# Patient Record
Sex: Female | Born: 1960 | Race: Black or African American | Hispanic: No | Marital: Married | Smoking: Never smoker
Health system: Southern US, Community
[De-identification: ages and names within clinical notes are randomized; demographics above are authoritative.]

## PROBLEM LIST (undated history)

## (undated) DIAGNOSIS — Z8659 Personal history of other mental and behavioral disorders: Secondary | ICD-10-CM

## (undated) DIAGNOSIS — T7840XA Allergy, unspecified, initial encounter: Secondary | ICD-10-CM

## (undated) DIAGNOSIS — E079 Disorder of thyroid, unspecified: Secondary | ICD-10-CM

## (undated) DIAGNOSIS — G8929 Other chronic pain: Secondary | ICD-10-CM

## (undated) HISTORY — PX: MYOMECTOMY: SHX85

## (undated) HISTORY — PX: BUNIONECTOMY: SHX129

## (undated) HISTORY — DX: Disorder of thyroid, unspecified: E07.9

## (undated) HISTORY — DX: Personal history of other mental and behavioral disorders: Z86.59

## (undated) HISTORY — DX: Allergy, unspecified, initial encounter: T78.40XA

## (undated) HISTORY — DX: Other chronic pain: G89.29

---

## 1997-05-20 ENCOUNTER — Other Ambulatory Visit: Admission: RE | Admit: 1997-05-20 | Discharge: 1997-05-20 | Payer: Self-pay | Admitting: Obstetrics & Gynecology

## 1998-05-12 ENCOUNTER — Encounter: Payer: Self-pay | Admitting: Obstetrics and Gynecology

## 1998-05-12 ENCOUNTER — Ambulatory Visit (HOSPITAL_COMMUNITY): Admission: RE | Admit: 1998-05-12 | Discharge: 1998-05-12 | Payer: Self-pay | Admitting: Obstetrics and Gynecology

## 1999-04-22 ENCOUNTER — Other Ambulatory Visit: Admission: RE | Admit: 1999-04-22 | Discharge: 1999-04-22 | Payer: Self-pay | Admitting: Obstetrics and Gynecology

## 1999-05-26 ENCOUNTER — Encounter: Payer: Self-pay | Admitting: Obstetrics and Gynecology

## 1999-05-26 ENCOUNTER — Ambulatory Visit (HOSPITAL_COMMUNITY): Admission: RE | Admit: 1999-05-26 | Discharge: 1999-05-26 | Payer: Self-pay | Admitting: Obstetrics and Gynecology

## 1999-06-01 ENCOUNTER — Emergency Department (HOSPITAL_COMMUNITY): Admission: EM | Admit: 1999-06-01 | Discharge: 1999-06-01 | Payer: Self-pay | Admitting: Emergency Medicine

## 1999-06-02 ENCOUNTER — Encounter: Payer: Self-pay | Admitting: Emergency Medicine

## 1999-07-05 ENCOUNTER — Inpatient Hospital Stay (HOSPITAL_COMMUNITY): Admission: RE | Admit: 1999-07-05 | Discharge: 1999-07-07 | Payer: Self-pay | Admitting: Obstetrics and Gynecology

## 2000-06-02 ENCOUNTER — Inpatient Hospital Stay (HOSPITAL_COMMUNITY): Admission: AD | Admit: 2000-06-02 | Discharge: 2000-06-03 | Payer: Self-pay | Admitting: Obstetrics and Gynecology

## 2000-06-03 ENCOUNTER — Encounter: Payer: Self-pay | Admitting: Obstetrics and Gynecology

## 2000-06-04 ENCOUNTER — Encounter (INDEPENDENT_AMBULATORY_CARE_PROVIDER_SITE_OTHER): Payer: Self-pay | Admitting: Specialist

## 2000-06-04 ENCOUNTER — Inpatient Hospital Stay (HOSPITAL_COMMUNITY): Admission: AD | Admit: 2000-06-04 | Discharge: 2000-06-04 | Payer: Self-pay | Admitting: Obstetrics and Gynecology

## 2000-06-04 ENCOUNTER — Ambulatory Visit (HOSPITAL_COMMUNITY): Admission: RE | Admit: 2000-06-04 | Discharge: 2000-06-04 | Payer: Self-pay | Admitting: Obstetrics and Gynecology

## 2000-09-15 ENCOUNTER — Other Ambulatory Visit: Admission: RE | Admit: 2000-09-15 | Discharge: 2000-09-15 | Payer: Self-pay | Admitting: Obstetrics and Gynecology

## 2000-09-19 ENCOUNTER — Encounter: Admission: RE | Admit: 2000-09-19 | Discharge: 2000-09-19 | Payer: Self-pay | Admitting: Family Medicine

## 2000-09-19 ENCOUNTER — Encounter: Payer: Self-pay | Admitting: Family Medicine

## 2000-11-13 ENCOUNTER — Encounter: Payer: Self-pay | Admitting: Gastroenterology

## 2000-11-13 ENCOUNTER — Ambulatory Visit (HOSPITAL_COMMUNITY): Admission: RE | Admit: 2000-11-13 | Discharge: 2000-11-13 | Payer: Self-pay | Admitting: Gastroenterology

## 2001-11-28 ENCOUNTER — Other Ambulatory Visit: Admission: RE | Admit: 2001-11-28 | Discharge: 2001-11-28 | Payer: Self-pay | Admitting: Obstetrics and Gynecology

## 2002-06-12 ENCOUNTER — Encounter: Payer: Self-pay | Admitting: Obstetrics and Gynecology

## 2002-06-12 ENCOUNTER — Ambulatory Visit (HOSPITAL_COMMUNITY): Admission: RE | Admit: 2002-06-12 | Discharge: 2002-06-12 | Payer: Self-pay | Admitting: Urology

## 2003-01-24 ENCOUNTER — Other Ambulatory Visit: Admission: RE | Admit: 2003-01-24 | Discharge: 2003-01-24 | Payer: Self-pay | Admitting: Obstetrics and Gynecology

## 2004-02-03 ENCOUNTER — Other Ambulatory Visit: Admission: RE | Admit: 2004-02-03 | Discharge: 2004-02-03 | Payer: Self-pay | Admitting: Obstetrics and Gynecology

## 2004-10-11 ENCOUNTER — Encounter: Admission: RE | Admit: 2004-10-11 | Discharge: 2005-01-09 | Payer: Self-pay | Admitting: Anesthesiology

## 2004-10-19 ENCOUNTER — Other Ambulatory Visit: Admission: RE | Admit: 2004-10-19 | Discharge: 2004-10-19 | Payer: Self-pay | Admitting: Obstetrics and Gynecology

## 2004-11-05 ENCOUNTER — Ambulatory Visit: Payer: Self-pay | Admitting: Physical Medicine & Rehabilitation

## 2004-11-05 ENCOUNTER — Encounter
Admission: RE | Admit: 2004-11-05 | Discharge: 2005-02-03 | Payer: Self-pay | Admitting: Physical Medicine & Rehabilitation

## 2005-03-15 ENCOUNTER — Ambulatory Visit (HOSPITAL_COMMUNITY): Admission: RE | Admit: 2005-03-15 | Discharge: 2005-03-15 | Payer: Self-pay | Admitting: Obstetrics and Gynecology

## 2005-06-14 ENCOUNTER — Emergency Department (HOSPITAL_COMMUNITY): Admission: EM | Admit: 2005-06-14 | Discharge: 2005-06-15 | Payer: Self-pay | Admitting: Emergency Medicine

## 2006-05-24 ENCOUNTER — Ambulatory Visit (HOSPITAL_COMMUNITY): Admission: RE | Admit: 2006-05-24 | Discharge: 2006-05-24 | Payer: Self-pay | Admitting: Obstetrics and Gynecology

## 2007-09-20 ENCOUNTER — Ambulatory Visit (HOSPITAL_COMMUNITY): Admission: RE | Admit: 2007-09-20 | Discharge: 2007-09-20 | Payer: Self-pay | Admitting: Obstetrics and Gynecology

## 2007-12-19 ENCOUNTER — Other Ambulatory Visit: Admission: RE | Admit: 2007-12-19 | Discharge: 2007-12-19 | Payer: Self-pay | Admitting: Obstetrics and Gynecology

## 2009-03-02 ENCOUNTER — Encounter (INDEPENDENT_AMBULATORY_CARE_PROVIDER_SITE_OTHER): Payer: Self-pay | Admitting: *Deleted

## 2009-03-04 ENCOUNTER — Ambulatory Visit: Payer: Self-pay | Admitting: Internal Medicine

## 2009-03-05 ENCOUNTER — Ambulatory Visit (HOSPITAL_COMMUNITY): Admission: RE | Admit: 2009-03-05 | Discharge: 2009-03-05 | Payer: Self-pay | Admitting: Obstetrics and Gynecology

## 2009-03-24 ENCOUNTER — Ambulatory Visit: Payer: Self-pay | Admitting: Internal Medicine

## 2009-03-31 ENCOUNTER — Encounter: Payer: Self-pay | Admitting: Internal Medicine

## 2010-02-07 ENCOUNTER — Encounter: Payer: Self-pay | Admitting: Obstetrics and Gynecology

## 2010-02-18 NOTE — Letter (Signed)
Summary: [facility] Instructions  [facility]  [address] [place], [place] [address]   Phone: [phone]  Fax: [phone]       Katrina Marquez    1960/09/22    MRN: [id number]       Procedure Day /Date:  Tuesday 03/24/2009     Arrival Time:  10:30 am     Procedure Time: 11:30 am     Location of Procedure:                    _x _  [facility] (4th Floor)    PREPARATION FOR COLONOSCOPY WITH MIRALAX  Starting 5 days prior to your procedure Thursday 3/3 do not eat nuts, seeds, popcorn, corn, beans, peas,  salads, or any raw vegetables.  Do not take any fiber supplements (e.g. Metamucil, Citrucel, and Benefiber). ____________________________________________________________________________________________________   THE DAY BEFORE YOUR PROCEDURE         DATE: Monday 3/7  1   Drink clear liquids the entire day-NO SOLID FOOD  2   Do not drink anything colored red or purple.  Avoid juices with pulp.  No orange juice.  3   Drink at least 64 oz. (8 glasses) of fluid/clear liquids during the day to prevent dehydration and help the prep work efficiently.  CLEAR LIQUIDS INCLUDE: Water Jello Ice Popsicles Tea (sugar ok, no milk/cream) Powdered fruit flavored drinks Coffee (sugar ok, no milk/cream) Gatorade Juice: apple, white grape, white cranberry  Lemonade Clear bullion, consomm, broth Carbonated beverages (any kind) Strained chicken noodle soup Hard Candy  4   Mix the entire bottle of Miralax with 64 oz. of Gatorade/Powerade in the morning and put in the refrigerator to chill.  5   At 3:00 pm take 2 Dulcolax/Bisacodyl tablets.  6   At 4:30 pm take one Reglan/Metoclopramide tablet.  7  Starting at 5:00 pm drink one 8 oz glass of the Miralax mixture every 15-20 minutes until you have finished drinking the entire 64 oz.  You should finish drinking prep around 7:30 or 8:00 pm.  8   If you are nauseated, you may take the 2nd Reglan/Metoclopramide  tablet at 6:30 pm.        9    At 8:00 pm take 2 more DULCOLAX/Bisacodyl tablets.     THE DAY OF YOUR PROCEDURE      DATE:  Tuesday 3/8  You may drink clear liquids until 9:30 am  (2 HOURS BEFORE PROCEDURE).   MEDICATION INSTRUCTIONS  Unless otherwise instructed, you should take regular prescription medications with a small sip of water as early as possible the morning of your procedure.           OTHER INSTRUCTIONS  You will need a responsible adult at least 52 years of age to accompany you and drive you home.   This person must remain in the waiting room during your procedure.  Wear loose fitting clothing that is easily removed.  Leave jewelry and other valuables at home.  However, you may wish to bring a book to read or an iPod/MP3 player to listen to music as you wait for your procedure to start.  Remove all body piercing jewelry and leave at home.  Total time from sign-in until discharge is approximately 2-3 hours.  You should go home directly after your procedure and rest.  You can resume normal activities the day after your procedure.  The day of your procedure you should not:   Drive  Make legal decisions   Operate machinery   Drink alcohol   Return to work  You will receive specific instructions about eating, activities and medications before you leave.   The above instructions have been reviewed and explained to me by   Ezra Sites RN  March 04, 2009 2:02 PM     I fully understand and can verbalize these instructions _____________________________ Date _______

## 2010-02-18 NOTE — Procedures (Signed)
Summary: Colonoscopy  Patient: Del Graig Note: All result statuses are Final unless otherwise noted.  Tests: (1) Colonoscopy (COL)   COL Colonoscopy           DONE     [facility]     [address]     [place], [place]  [address]           COLONOSCOPY PROCEDURE REPORT           PATIENT:  Katrina Marquez, Katrina Marquez  MR#:  [id number]     BIRTHDATE:  [birth year], 48 yrs. old  GENDER:  female           ENDOSCOPIST:  Hedwig Morton. Juanda Chance, MD     Referred by:  Meredeth Ide, M.D.           PROCEDURE DATE:  03/24/2009     PROCEDURE:  Colonoscopy 19147     ASA CLASS:  Class I     INDICATIONS:  Routine Risk Screening           MEDICATIONS:   Versed 9 mg, Fentanyl 75 mcg           DESCRIPTION OF PROCEDURE:   After the risks benefits and     alternatives of the procedure were thoroughly explained, informed     consent was obtained.  Digital rectal exam was performed and     revealed no rectal masses.   The LB CF-[id number] endoscope     was introduced through the anus and advanced to the cecum, which     was identified by both the appendix and ileocecal valve, without     limitations.  The quality of the prep was good, using MiraLax.     The instrument was then slowly withdrawn as the colon was fully     examined.     <<PROCEDUREIMAGES>>           FINDINGS:  A sessile polyp was found. polyp vs a fold The polyp     was removed using cold biopsy forceps (see image4 and image5).     This was otherwise a normal examination of the colon (see image6,     image3, image2, and image1).   Retroflexed views in the rectum     revealed no abnormalities.    The scope was then withdrawn from     the patient and the procedure completed.           COMPLICATIONS:  None           ENDOSCOPIC IMPRESSION:     1) Sessile polyp     2) Otherwise normal examination     polyp vs irregular mucosal from scope trauma     RECOMMENDATIONS:     1) Await pathology results           REPEAT EXAM:  In 10  year(s) for.           ______________________________     Hedwig Morton. Juanda Chance, MD           CC:           n.     eSIGNED:   Hedwig Morton. Maylea Soria at 03/24/2009 12:51 PM           [place], Anes, [phone]  Note: An exclamation mark (!) indicates a result that was not dispersed into the flowsheet. Document Creation Date: 03/24/2009 12:51 PM _______________________________________________________________________  (1) Order result status: Final Collection or observation date-time: 03/24/2009 12:45 Requested date-time:  Receipt  date-time:  Reported date-time:  Referring Physician:   Ordering Physician: Lina Sar [phone]) Specimen Source:  Source: Launa Grill Order Number: [id number] Lab site:   Appended Document: Colonoscopy     Procedures Next Due Date:    Colonoscopy: 03/2014

## 2010-02-18 NOTE — Letter (Signed)
Summary: Patient Notice- Polyp Results  [facility]  [address] [place], [place] [address]   Phone: [phone]  Fax: [phone]        March 27, 2009 MRN: [id number]    [facility] [address] [place], [place]  [address]    Dear Katrina Marquez,  I am pleased to inform you that the colon polyp(s) removed during your recent colonoscopy was (were) found to be benign (no cancer detected) upon pathologic examination.The polyp was adenomatous ( precancerous)  I recommend you have a repeat colonoscopy examination in 5_ years to look for recurrent polyps, as having colon polyps increases your risk for having recurrent polyps or even colon cancer in the future.  Should you develop new or worsening symptoms of abdominal pain, bowel habit changes or bleeding from the rectum or bowels, please schedule an evaluation with either your primary care physician or with me.  Additional information/recommendations:  _x_ No further action with gastroenterology is needed at this time. Please      follow-up with your primary care physician for your other healthcare      needs.  __ Please call [phone] to schedule a return visit to review your      situation.  __ Please keep your follow-up visit as already scheduled.  __ Continue treatment plan as outlined the day of your exam.  Please call us if you are having persistent problems or have questions about your condition that have not been fully answered at this time.  Sincerely,  Hart Carwin MD  This letter has been electronically signed by your physician.  Appended Document: Patient Notice- Polyp Results Letter mailed 3.15.11.

## 2010-02-18 NOTE — Letter (Signed)
Summary: Patient Notice- Polyp Results  [facility]  [address] [place], [place] [address]   Phone: [phone]  Fax: [phone]        March 27, 2009 MRN: [id number]    [facility] [address] [place], [place]  [address]    Dear Ms. Hoecker,  I am pleased to inform you that the colon polyp(s) removed during your recent colonoscopy was (were) found to be benign (no cancer detected) upon pathologic examination.The polyp was adenomatous ( precancerous)  I recommend you have a repeat colonoscopy examination in 5_ years to look for recurrent polyps, as having colon polyps increases your risk for having recurrent polyps or even colon cancer in the future.  Should you develop new or worsening symptoms of abdominal pain, bowel habit changes or bleeding from the rectum or bowels, please schedule an evaluation with either your primary care physician or with me.  Additional information/recommendations:  _x_ No further action with gastroenterology is needed at this time. Please      follow-up with your primary care physician for your other healthcare      needs.  __ Please call [phone] to schedule a return visit to review your      situation.  __ Please keep your follow-up visit as already scheduled.  __ Continue treatment plan as outlined the day of your exam.  Please call us if you are having persistent problems or have questions about your condition that have not been fully answered at this time.  Sincerely,  Hart Carwin MD  This letter has been electronically signed by your physician.

## 2010-02-18 NOTE — Miscellaneous (Signed)
Summary: [facility]  Clinical Lists Changes  Medications: Added new medication of MIRALAX   POWD (POLYETHYLENE GLYCOL 3350) As per prep  instructions. - Signed Added new medication of REGLAN 10 MG  TABS (METOCLOPRAMIDE HCL) As per prep instructions. - Signed Added new medication of DULCOLAX 5 MG  TBEC (BISACODYL) Day before procedure take 2 at 3pm and 2 at 8pm. - Signed Rx of MIRALAX   POWD (POLYETHYLENE GLYCOL 3350) As per prep  instructions.;  #255gm x 0;  Signed;  Entered by: Ezra Sites RN;  Authorized by: Hart Carwin MD;  Method used: Electronically to [facility]  [phone]*, [address] [place], [place]  [address], Ph: [phone] or [phone], Fax: [phone] Rx of REGLAN 10 MG  TABS (METOCLOPRAMIDE HCL) As per prep instructions.;  #2 x 0;  Signed;  Entered by: Ezra Sites RN;  Authorized by: Hart Carwin MD;  Method used: Electronically to [facility]  [phone]*, [address], [place], [place]  [address], Ph: [phone] or [phone], Fax: [phone] Rx of DULCOLAX 5 MG  TBEC (BISACODYL) Day before procedure take 2 at 3pm and 2 at 8pm.;  #4 x 0;  Signed;  Entered by: Ezra Sites RN;  Authorized by: Hart Carwin MD;  Method used: Electronically to [facility]  [phone]*, [address], [place], [place]  [address], Ph: [phone] or [phone], Fax: [phone] Observations: Added new observation of NKA: T (03/04/2009 13:32)    Prescriptions: DULCOLAX 5 MG  TBEC (BISACODYL) Day before procedure take 2 at 3pm and 2 at 8pm.  #4 x 0   Entered by:   Ezra Sites RN   Authorized by:   Hart Carwin MD   Signed by:   Ezra Sites RN on 03/04/2009   Method used:   Electronically to        [facility]  [phone]* (retail)       [address] [place], [place]  [address]       Ph: [phone] or [phone]       Fax: [phone]   RxID:   [id number] REGLAN 10 MG  TABS (METOCLOPRAMIDE HCL) As per prep instructions.  #2 x 0   Entered by:   Ezra Sites RN   Authorized by:   Hart Carwin MD   Signed by:   Ezra Sites RN on 03/04/2009   Method used:   Electronically to        [facility]  [phone]* (retail)       [address] [place], [place]  [address]       Ph: [phone] or [phone]       Fax: [phone]   RxID:   [id number] MIRALAX   POWD (POLYETHYLENE GLYCOL 3350) As per prep  instructions.  #255gm x 0   Entered by:   Ezra Sites RN   Authorized by:   Hart Carwin MD   Signed by:   Ezra Sites RN on 03/04/2009   Method used:   Electronically to        [facility]  [phone]* (retail)       [address] [place], [place]  [address]       Ph: [phone] or [phone]       Fax: [phone]   RxID:   [id number]

## 2010-06-04 NOTE — Op Note (Signed)
[facility]  Patient:    Katrina Marquez, Katrina Marquez                       MRN: [id number] Proc. Date: 06/04/00 Adm. Date:  [id number] Attending:  Leonard Schwartz                           Operative Report  PREOPERATIVE DIAGNOSIS:         First trimester missed abortion.  POSTOPERATIVE DIAGNOSIS:        First trimester missed abortion.  OPERATION:                      Suction, dilatation and evacuation.  SURGEON:                        Janine Limbo, M.D.  ANESTHESIA:                     IV sedation and paracervical block using 0.50% Marcaine.  DISPOSITION:                    [name] is a 50 year old female, gravida 2, para 1-0-0-1 who presents with a quantitative hCG of 10,494 (Jun 02, 2000), and then decreasing to 7689 (Jun 04, 2000).  An ultrasound showed an empty sac.  The patient was noted to have a blood type of O negative and she received RhoGAM on Jun 02, 2000.  The patient understands the indications for her procedure, and she accepts the risks of, but not limited to, anesthetic complications, bleeding, infections, and possible damage to the surrounding organs.  FINDINGS:                       The patients hemoglobin today was 12.5.  Her white count was 6600.  A moderate amount of products of conception were removed from within the uterine cavity.  DESCRIPTION OF PROCEDURE:       The patient was taken to the operating room where she was given medication through her IV line.  The perineum and vagina were prepped in multiple layers of Hibiclens and then sterilely draped. Examination under anesthesia was performed.  The bladder was drained of urine. A paracervical block was placed using 10 cc of 0.25% Marcaine without epinephrine.  The uterus was sounded to 9 cm.  The cervix was grasped with the dilator.  The uterine cavity was evacuated using a size 10 suction curet and then a medium sharp curet.  The cavity was felt to be clean at  the end of our procedure.  Hemostasis was noted to be adequate.  All instruments were removed.  Repeat examination showed a firm uterus that was approximately 6-8 weeks size.  The estimated blood loss was 20 cc.  The patient was taken to the recovery room in stable condition.  FOLLOW-UP INSTRUCTIONS:         The patient will return to see Dr. Stefano Gaul in two to three weeks for follow-up examination.  She was given a copy of the postoperative instruction sheet as prepared by the [facility] for patients who have undergone a dilatation and curettage.  She was given a prescription for Percocet one to two every four hours as needed for pain (total of 20).  No refills. DD:  06/04/00 TD:  06/05/00 Job: [id number]
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
## 2010-06-07 ENCOUNTER — Other Ambulatory Visit (HOSPITAL_COMMUNITY): Payer: Self-pay | Admitting: Obstetrics & Gynecology

## 2010-06-07 DIAGNOSIS — Z1231 Encounter for screening mammogram for malignant neoplasm of breast: Secondary | ICD-10-CM

## 2010-06-16 ENCOUNTER — Ambulatory Visit (HOSPITAL_COMMUNITY): Payer: BC Managed Care – PPO

## 2010-07-15 ENCOUNTER — Ambulatory Visit (HOSPITAL_COMMUNITY)
Admission: RE | Admit: 2010-07-15 | Discharge: 2010-07-15 | Disposition: A | Discharge status: Home / Self Care | Payer: BC Managed Care – PPO | Source: Ambulatory Visit | Attending: Obstetrics & Gynecology | Admitting: Obstetrics & Gynecology

## 2010-07-15 DIAGNOSIS — Z1231 Encounter for screening mammogram for malignant neoplasm of breast: Secondary | ICD-10-CM | POA: Insufficient documentation

## 2011-10-14 ENCOUNTER — Other Ambulatory Visit (HOSPITAL_COMMUNITY): Payer: Self-pay | Admitting: Internal Medicine

## 2011-10-14 DIAGNOSIS — Z1231 Encounter for screening mammogram for malignant neoplasm of breast: Secondary | ICD-10-CM

## 2011-10-28 ENCOUNTER — Ambulatory Visit (HOSPITAL_COMMUNITY)
Admission: RE | Admit: 2011-10-28 | Discharge: 2011-10-28 | Disposition: A | Discharge status: Home / Self Care | Payer: BC Managed Care – PPO | Source: Ambulatory Visit | Attending: Internal Medicine | Admitting: Internal Medicine

## 2011-10-28 DIAGNOSIS — Z1231 Encounter for screening mammogram for malignant neoplasm of breast: Secondary | ICD-10-CM | POA: Insufficient documentation

## 2014-03-04 ENCOUNTER — Encounter: Payer: Self-pay | Admitting: Internal Medicine

## 2014-08-26 ENCOUNTER — Encounter: Payer: Self-pay | Admitting: Internal Medicine

## 2014-12-24 ENCOUNTER — Ambulatory Visit: Payer: Self-pay | Admitting: Podiatry

## 2014-12-31 ENCOUNTER — Ambulatory Visit: Payer: Self-pay | Admitting: Podiatry

## 2015-04-17 ENCOUNTER — Encounter: Payer: Self-pay | Admitting: Internal Medicine

## 2015-07-09 ENCOUNTER — Other Ambulatory Visit: Payer: Self-pay | Admitting: Internal Medicine

## 2015-07-09 DIAGNOSIS — E041 Nontoxic single thyroid nodule: Secondary | ICD-10-CM

## 2015-07-16 ENCOUNTER — Ambulatory Visit (HOSPITAL_COMMUNITY)
Admission: RE | Admit: 2015-07-16 | Discharge: 2015-07-16 | Disposition: A | Discharge status: Home / Self Care | Payer: BLUE CROSS/BLUE SHIELD | Source: Ambulatory Visit | Attending: Internal Medicine | Admitting: Internal Medicine

## 2015-07-16 DIAGNOSIS — E041 Nontoxic single thyroid nodule: Secondary | ICD-10-CM

## 2015-07-28 ENCOUNTER — Other Ambulatory Visit: Payer: Self-pay | Admitting: Internal Medicine

## 2015-07-28 DIAGNOSIS — E041 Nontoxic single thyroid nodule: Secondary | ICD-10-CM

## 2015-08-06 ENCOUNTER — Other Ambulatory Visit (HOSPITAL_COMMUNITY)
Admission: RE | Admit: 2015-08-06 | Discharge: 2015-08-06 | Disposition: A | Discharge status: Home / Self Care | Payer: BLUE CROSS/BLUE SHIELD | Source: Ambulatory Visit | Attending: General Surgery | Admitting: General Surgery

## 2015-08-06 ENCOUNTER — Ambulatory Visit
Admission: RE | Admit: 2015-08-06 | Discharge: 2015-08-06 | Disposition: A | Discharge status: Home / Self Care | Payer: BLUE CROSS/BLUE SHIELD | Source: Ambulatory Visit | Attending: Internal Medicine | Admitting: Internal Medicine

## 2015-08-06 DIAGNOSIS — E041 Nontoxic single thyroid nodule: Secondary | ICD-10-CM | POA: Insufficient documentation

## 2016-04-08 ENCOUNTER — Other Ambulatory Visit: Payer: Self-pay | Admitting: Physician Assistant

## 2016-04-08 DIAGNOSIS — E049 Nontoxic goiter, unspecified: Secondary | ICD-10-CM

## 2016-08-11 ENCOUNTER — Encounter (HOSPITAL_COMMUNITY): Payer: Self-pay | Admitting: Internal Medicine

## 2016-08-11 ENCOUNTER — Observation Stay (HOSPITAL_COMMUNITY)
Admission: EM | Admit: 2016-08-11 | Discharge: 2016-08-13 | Disposition: A | Discharge status: Home / Self Care | Payer: BLUE CROSS/BLUE SHIELD | Attending: Family Medicine | Admitting: Family Medicine

## 2016-08-11 ENCOUNTER — Emergency Department (HOSPITAL_COMMUNITY): Payer: BLUE CROSS/BLUE SHIELD

## 2016-08-11 ENCOUNTER — Observation Stay (HOSPITAL_COMMUNITY): Payer: BLUE CROSS/BLUE SHIELD

## 2016-08-11 DIAGNOSIS — Z8249 Family history of ischemic heart disease and other diseases of the circulatory system: Secondary | ICD-10-CM | POA: Insufficient documentation

## 2016-08-11 DIAGNOSIS — Z91013 Allergy to seafood: Secondary | ICD-10-CM | POA: Diagnosis not present

## 2016-08-11 DIAGNOSIS — H532 Diplopia: Secondary | ICD-10-CM | POA: Insufficient documentation

## 2016-08-11 DIAGNOSIS — K7689 Other specified diseases of liver: Secondary | ICD-10-CM

## 2016-08-11 DIAGNOSIS — R61 Generalized hyperhidrosis: Secondary | ICD-10-CM | POA: Diagnosis not present

## 2016-08-11 DIAGNOSIS — F329 Major depressive disorder, single episode, unspecified: Secondary | ICD-10-CM | POA: Diagnosis not present

## 2016-08-11 DIAGNOSIS — E042 Nontoxic multinodular goiter: Secondary | ICD-10-CM | POA: Diagnosis not present

## 2016-08-11 DIAGNOSIS — G939 Disorder of brain, unspecified: Secondary | ICD-10-CM | POA: Diagnosis not present

## 2016-08-11 DIAGNOSIS — R42 Dizziness and giddiness: Secondary | ICD-10-CM | POA: Diagnosis not present

## 2016-08-11 DIAGNOSIS — I493 Ventricular premature depolarization: Secondary | ICD-10-CM | POA: Insufficient documentation

## 2016-08-11 DIAGNOSIS — R0602 Shortness of breath: Secondary | ICD-10-CM | POA: Diagnosis present

## 2016-08-11 DIAGNOSIS — M542 Cervicalgia: Secondary | ICD-10-CM | POA: Diagnosis not present

## 2016-08-11 DIAGNOSIS — G8929 Other chronic pain: Secondary | ICD-10-CM | POA: Diagnosis not present

## 2016-08-11 DIAGNOSIS — N281 Cyst of kidney, acquired: Secondary | ICD-10-CM | POA: Diagnosis not present

## 2016-08-11 DIAGNOSIS — Z91018 Allergy to other foods: Secondary | ICD-10-CM | POA: Insufficient documentation

## 2016-08-11 DIAGNOSIS — R9089 Other abnormal findings on diagnostic imaging of central nervous system: Secondary | ICD-10-CM

## 2016-08-11 DIAGNOSIS — I639 Cerebral infarction, unspecified: Secondary | ICD-10-CM

## 2016-08-11 DIAGNOSIS — G936 Cerebral edema: Secondary | ICD-10-CM | POA: Insufficient documentation

## 2016-08-11 LAB — CBC WITH DIFFERENTIAL/PLATELET
Basophils Absolute: 0 10*3/uL (ref 0.0–0.1)
Basophils Relative: 1 %
Eosinophils Absolute: 0 10*3/uL (ref 0.0–0.7)
Eosinophils Relative: 0 %
HCT: 40.1 % (ref 36.0–46.0)
Hemoglobin: 13.6 g/dL (ref 12.0–15.0)
Lymphocytes Relative: 21 %
Lymphs Abs: 1.3 10*3/uL (ref 0.7–4.0)
MCH: 30.3 pg (ref 26.0–34.0)
MCHC: 33.9 g/dL (ref 30.0–36.0)
MCV: 89.3 fL (ref 78.0–100.0)
Monocytes Absolute: 0.3 10*3/uL (ref 0.1–1.0)
Monocytes Relative: 4 %
Neutro Abs: 4.7 10*3/uL (ref 1.7–7.7)
Neutrophils Relative %: 74 %
Platelets: 274 10*3/uL (ref 150–400)
RBC: 4.49 MIL/uL (ref 3.87–5.11)
RDW: 12.1 % (ref 11.5–15.5)
WBC: 6.3 10*3/uL (ref 4.0–10.5)

## 2016-08-11 LAB — URINALYSIS, ROUTINE W REFLEX MICROSCOPIC
Bilirubin Urine: NEGATIVE
GLUCOSE, UA: NEGATIVE mg/dL
HGB URINE DIPSTICK: NEGATIVE
Ketones, ur: 20 mg/dL — AB
Leukocytes, UA: NEGATIVE
Nitrite: NEGATIVE
PROTEIN: NEGATIVE mg/dL
Specific Gravity, Urine: 1.008 (ref 1.005–1.030)
pH: 7 (ref 5.0–8.0)

## 2016-08-11 LAB — BASIC METABOLIC PANEL
Anion gap: 8 (ref 5–15)
BUN: 13 mg/dL (ref 6–20)
CO2: 25 mmol/L (ref 22–32)
Calcium: 9.6 mg/dL (ref 8.9–10.3)
Chloride: 105 mmol/L (ref 101–111)
Creatinine, Ser: 1.01 mg/dL — ABNORMAL HIGH (ref 0.44–1.00)
GFR calc Af Amer: >60 mL/min (ref >60)
GFR calc non Af Amer: >60 mL/min (ref >60)
Glucose, Bld: 98 mg/dL (ref 65–99)
Potassium: 4 mmol/L (ref 3.5–5.1)
Sodium: 138 mmol/L (ref 135–145)

## 2016-08-11 LAB — TSH: TSH: 0.996 u[IU]/mL (ref 0.350–4.500)

## 2016-08-11 LAB — D-DIMER, QUANTITATIVE: D-Dimer, Quant: 0.29 ug/mL-FEU (ref 0.00–0.50)

## 2016-08-11 LAB — TROPONIN I: Troponin I: <0.03 ng/mL (ref <0.03)

## 2016-08-11 MED ORDER — SODIUM CHLORIDE 0.9 % IV SOLN
INTRAVENOUS | Status: DC
  Administered 2016-08-12 (×2): via INTRAVENOUS

## 2016-08-11 MED ORDER — MECLIZINE HCL 25 MG PO TABS
25.0000 mg | ORAL_TABLET | Freq: Once | ORAL | Status: AC
  Administered 2016-08-11: 25 mg via ORAL
  Filled 2016-08-11: qty 1

## 2016-08-11 MED ORDER — ONDANSETRON HCL 4 MG/2ML IJ SOLN: 4.0000 mg | Freq: Four times a day (QID) | INTRAMUSCULAR | Status: DC | PRN

## 2016-08-11 MED ORDER — ACETAMINOPHEN 650 MG RE SUPP: 650.0000 mg | Freq: Four times a day (QID) | RECTAL | Status: DC | PRN

## 2016-08-11 MED ORDER — ONDANSETRON HCL 4 MG PO TABS: 4.0000 mg | ORAL_TABLET | Freq: Four times a day (QID) | ORAL | Status: DC | PRN

## 2016-08-11 MED ORDER — ACETAMINOPHEN 325 MG PO TABS: 650.0000 mg | ORAL_TABLET | Freq: Four times a day (QID) | ORAL | Status: DC | PRN

## 2016-08-11 MED ORDER — SODIUM CHLORIDE 0.9 % IV BOLUS (SEPSIS)
1000.0000 mL | Freq: Once | INTRAVENOUS | Status: AC
  Administered 2016-08-11: 1000 mL via INTRAVENOUS

## 2016-08-11 MED ORDER — SODIUM CHLORIDE 0.9 % IV SOLN
Freq: Once | INTRAVENOUS | Status: AC
  Administered 2016-08-11: 21:00:00 via INTRAVENOUS

## 2016-08-11 NOTE — Progress Notes (Signed)
Received report from ED. Pt gone to MRI which will take about 1 housr and then will come to unit per ED nurse. Ranelle Oyster, RN

## 2016-08-11 NOTE — ED Notes (Signed)
Pt st's nausea and dizziness has subsided.  Pt alert and oriented x's 3, skin warm and dry, color appropriate.  Friends at bedside.

## 2016-08-11 NOTE — H&P (Addendum)
History and Physical    Katrina Marquez ZOX:[id number] DOB: 04-24-1960 DOA: 08/11/2016  PCP: Benito Mccreedy, MD  Patient coming from: Home.  Chief Complaint: Dizziness.  HPI: Katrina Marquez is a 56 y.o. female with history of depression and chronic neck pain was brought to the ER patient had sudden onset of dizziness nausea diaphoresis while working on her computer and 1:45 PM. Along with this patient also had right sided neck pain on the jugulodigastric area. This was different from her regular neck pain. Patient immediately tried to lie on the floor. After trying to get up again patient was feeling dizzy. At this point EMS was called and patient was brought to the ER.   ED Course: In the ER patient apparently nonfocal. But on trying to sit up patient felt dizzy. Patient states she has chronic tinnitus. Denies any difficulty hearing. Patient is being admitted for further observation.  Review of Systems: As per HPI, rest all negative.   History reviewed. No pertinent past medical history.  Past Surgical History:  Procedure Laterality Date  . MYOMECTOMY       reports that she has never smoked. She has never used smokeless tobacco. She reports that she drinks alcohol. Her drug history is not on file.  Not on File  Family History  Problem Relation Age of Onset  . CAD Mother   . CAD Father     Prior to Admission medications   Not on File    Physical Exam: Vitals:   08/11/16 2000 08/11/16 2030 08/11/16 2045 08/11/16 2100  BP: 131/85 116/77 131/78 133/69  Pulse: 79 92 81 81  Resp: 19 (!) 28 17 19   Temp:      TempSrc:      SpO2: 99% 100% 100% 99%      Constitutional: Moderately built and nourished. Vitals:   08/11/16 2000 08/11/16 2030 08/11/16 2045 08/11/16 2100  BP: 131/85 116/77 131/78 133/69  Pulse: 79 92 81 81  Resp: 19 (!) 28 17 19   Temp:      TempSrc:      SpO2: 99% 100% 100% 99%   Eyes: Anicteric no pallor. ENMT: No discharge from the ears eyes  nose and mouth. Neck: No neck rigidity no mass felt. Respiratory: No rhonchi or crepitations. Cardiovascular: S1-S2 heard no murmurs appreciated. Abdomen: Soft nontender bowel sounds present. Musculoskeletal: No edema. No joint effusion. Skin: No rash. Skin appears warm. Neurologic: Alert awake oriented to time place and person. Moves all extremities 5 x 5. No facial asymmetry. Tongue is midline. Pupils are equal and reacting to light. No nystagmus. Psychiatric: Appears normal. Normal affect.   Labs on Admission: I have personally reviewed following labs and imaging studies  CBC:  Recent Labs Lab 08/11/16 1839  WBC 6.3  NEUTROABS 4.7  HGB 13.6  HCT 40.1  MCV 89.3  PLT 811   Basic Metabolic Panel:  Recent Labs Lab 08/11/16 1839  NA 138  K 4.0  CL 105  CO2 25  GLUCOSE 98  BUN 13  CREATININE 1.01*  CALCIUM 9.6   GFR: CrCl cannot be calculated (Unknown ideal weight.). Liver Function Tests: No results for input(s): AST, ALT, ALKPHOS, BILITOT, PROT, ALBUMIN in the last 168 hours. No results for input(s): LIPASE, AMYLASE in the last 168 hours. No results for input(s): AMMONIA in the last 168 hours. Coagulation Profile: No results for input(s): INR, PROTIME in the last 168 hours. Cardiac Enzymes: No results for input(s): CKTOTAL, CKMB, CKMBINDEX, TROPONINI in  the last 168 hours. BNP (last 3 results) No results for input(s): PROBNP in the last 8760 hours. HbA1C: No results for input(s): HGBA1C in the last 72 hours. CBG: No results for input(s): GLUCAP in the last 168 hours. Lipid Profile: No results for input(s): CHOL, HDL, LDLCALC, TRIG, CHOLHDL, LDLDIRECT in the last 72 hours. Thyroid Function Tests: No results for input(s): TSH, T4TOTAL, FREET4, T3FREE, THYROIDAB in the last 72 hours. Anemia Panel: No results for input(s): VITAMINB12, FOLATE, FERRITIN, TIBC, IRON, RETICCTPCT in the last 72 hours. Urine analysis: No results found for: COLORURINE, APPEARANCEUR,  LABSPEC, PHURINE, GLUCOSEU, HGBUR, BILIRUBINUR, KETONESUR, PROTEINUR, UROBILINOGEN, NITRITE, LEUKOCYTESUR Sepsis Labs: @LABRCNTIP (procalcitonin:4,lacticidven:4) )No results found for this or any previous visit (from the past 240 hour(s)).   Radiological Exams on Admission: No results found.   Assessment/Plan Principal Problem:   Dizziness    1. Dizziness with nausea and neck pain with diaphoresis - cause not clear. MRI of the brain and MRA of the brain and neck chest x-ray and EKGs have been ordered. Check orthostatics. Further plan depending on the tests ordered. 2. History of depression/pain on Effexor and Flexeril.  Addendum - patient's MRI showed small lesion in the vermis with edema. Discuss with Dr. Cheral Marker on-call neurologist who had evaluated the patient and advised patient will need MRI brain with and without contrast and metastatic workup and neurosurgery consult.    DVT prophylaxis: SCDs until MRI results available. Code Status: Full code.  Family Communication: Discussed with patient.  Disposition Plan: Home.  Consults called: Neurology.  Admission status: Observation.    Rise Patience MD [facility] Pager [phone].  If 7PM-7AM, please contact night-coverage [email or web address] Password [email or web address]  08/11/2016, 9:10 PM

## 2016-08-11 NOTE — ED Notes (Signed)
Pt ambulatory to bathroom without any problems 

## 2016-08-11 NOTE — ED Triage Notes (Signed)
Pt to ED via [facility] with c/o sudden onset of dizziness, nausea and right jaw pain.  Symptoms started while she was on her computer.  EMS gave pt Zofran 4mg  SL with relief of nausea.

## 2016-08-11 NOTE — ED Notes (Signed)
Pt to MRI at this time.  Pt to be transported to 5W02 after MRI completed

## 2016-08-11 NOTE — ED Notes (Signed)
PT resting quietly at this time,.  St's nausea and dizziness have subsided.

## 2016-08-12 ENCOUNTER — Observation Stay (HOSPITAL_COMMUNITY): Payer: BLUE CROSS/BLUE SHIELD

## 2016-08-12 DIAGNOSIS — R9089 Other abnormal findings on diagnostic imaging of central nervous system: Secondary | ICD-10-CM | POA: Diagnosis not present

## 2016-08-12 DIAGNOSIS — R42 Dizziness and giddiness: Secondary | ICD-10-CM | POA: Diagnosis not present

## 2016-08-12 DIAGNOSIS — N281 Cyst of kidney, acquired: Secondary | ICD-10-CM

## 2016-08-12 DIAGNOSIS — K7689 Other specified diseases of liver: Secondary | ICD-10-CM

## 2016-08-12 DIAGNOSIS — G939 Disorder of brain, unspecified: Secondary | ICD-10-CM

## 2016-08-12 LAB — HIV ANTIBODY (ROUTINE TESTING W REFLEX): HIV SCREEN 4TH GENERATION: NONREACTIVE

## 2016-08-12 LAB — CBC
HCT: 38.7 % (ref 36.0–46.0)
Hemoglobin: 13.1 g/dL (ref 12.0–15.0)
MCH: 30.5 pg (ref 26.0–34.0)
MCHC: 33.9 g/dL (ref 30.0–36.0)
MCV: 90.2 fL (ref 78.0–100.0)
PLATELETS: 299 10*3/uL (ref 150–400)
RBC: 4.29 MIL/uL (ref 3.87–5.11)
RDW: 12.5 % (ref 11.5–15.5)
WBC: 5.8 10*3/uL (ref 4.0–10.5)

## 2016-08-12 LAB — BASIC METABOLIC PANEL
Anion gap: 9 (ref 5–15)
BUN: 8 mg/dL (ref 6–20)
CALCIUM: 9.1 mg/dL (ref 8.9–10.3)
CO2: 23 mmol/L (ref 22–32)
CREATININE: 1.03 mg/dL — AB (ref 0.44–1.00)
Chloride: 108 mmol/L (ref 101–111)
GFR calc Af Amer: >60 mL/min (ref >60)
GFR calc non Af Amer: 59 mL/min — ABNORMAL LOW (ref >60)
Glucose, Bld: 98 mg/dL (ref 65–99)
Potassium: 3.8 mmol/L (ref 3.5–5.1)
SODIUM: 140 mmol/L (ref 135–145)

## 2016-08-12 LAB — TROPONIN I

## 2016-08-12 MED ORDER — GADOBENATE DIMEGLUMINE 529 MG/ML IV SOLN
10.0000 mL | Freq: Once | INTRAVENOUS | Status: AC
  Administered 2016-08-12: 10 mL via INTRAVENOUS

## 2016-08-12 MED ORDER — GADOBENATE DIMEGLUMINE 529 MG/ML IV SOLN
15.0000 mL | Freq: Once | INTRAVENOUS | Status: AC | PRN
  Administered 2016-08-12: 15 mL via INTRAVENOUS

## 2016-08-12 MED ORDER — IOPAMIDOL (ISOVUE-300) INJECTION 61%: 15.0000 mL | INTRAVENOUS | Status: AC

## 2016-08-12 MED ORDER — IOPAMIDOL (ISOVUE-300) INJECTION 61%
INTRAVENOUS | Status: AC
  Administered 2016-08-12: 100 mL
  Filled 2016-08-12: qty 100

## 2016-08-12 MED ORDER — VENLAFAXINE HCL ER 75 MG PO CP24
75.0000 mg | ORAL_CAPSULE | Freq: Every day | ORAL | Status: DC
  Administered 2016-08-12 – 2016-08-13 (×2): 75 mg via ORAL
  Filled 2016-08-12 (×2): qty 1

## 2016-08-12 NOTE — Progress Notes (Signed)
PROGRESS NOTE    Katrina Marquez  GGY:[id number] DOB: Jan 26, 1960 DOA: 08/11/2016 PCP: Benito Mccreedy, MD   Brief Narrative: Katrina Marquez is a 56 y.o. 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 Further imaging is pending.   Assessment & Plan:   Principal Problem:   Dizziness   Dizziness Nausea/vomiting Neck pain Likely secondary to brain lesion. Neurology recommending further workup. Unsure if this is infection versus neoplasm. Unsure if this is primary if a neoplasm. Afebrile. White blood cell count within normal limits. -MRI with contrast -CT chest and abdomen/pelvis with contrast -Consult neurosurgery pending results of imaging  Depression -Continue Effexor  Muscle spasm -Continue Flexeril  DVT prophylaxis: SCDs Code Status: Full code Family Communication: None at bedside Disposition Plan: Discharge today or tomorrow pending ability to obtain imaging and possible consults   Consultants:   Neurology  Procedures:   None  Antimicrobials:  None    Subjective: Patient reports no dizziness today. She is up and walk without any issues.  Objective: Vitals:   08/11/16 2045 08/11/16 2100 08/11/16 2354 08/12/16 0541  BP: 131/78 133/69 133/68 114/65  Pulse: 81 81 76 76  Resp: 17 19 18 18   Temp:   98.2 F (36.8 C) 98.3 F (36.8 C)  TempSrc:   Oral Oral  SpO2: 100% 99% 100% 99%  Weight:   71.8 kg (158 lb 3.2 oz)   Height:   5\' 6"  (1.676 m)     Intake/Output Summary (Last 24 hours) at 08/12/16 0948 Last data filed at 08/12/16 0350  Gross per 24 hour  Intake          1736.67 ml  Output                0 ml  Net          1736.67 ml   Filed Weights   08/11/16 2354  Weight: 71.8 kg (158 lb 3.2 oz)    Examination:  General exam: Appears calm and comfortable Respiratory system: Clear to auscultation. Respiratory effort  normal. Cardiovascular system: S1 & S2 heard, RRR. No murmurs, rubs, gallops or clicks. Gastrointestinal system: Abdomen is nondistended, soft and nontender. No organomegaly or masses felt. Normal bowel sounds heard. Central nervous system: Alert and oriented. No focal neurological deficits. 3+ bilateral patellar reflexes. Extremities: No edema. No calf tenderness Skin: No cyanosis. No rashes Psychiatry: Judgement and insight appear normal. Mood & affect appropriate.     Data Reviewed: I have personally reviewed following labs and imaging studies  CBC:  Recent Labs Lab 08/11/16 1839 08/12/16 0608  WBC 6.3 5.8  NEUTROABS 4.7  --   HGB 13.6 13.1  HCT 40.1 38.7  MCV 89.3 90.2  PLT 274 093   Basic Metabolic Panel:  Recent Labs Lab 08/11/16 1839 08/12/16 0608  NA 138 140  K 4.0 3.8  CL 105 108  CO2 25 23  GLUCOSE 98 98  BUN 13 8  CREATININE 1.01* 1.03*  CALCIUM 9.6 9.1   GFR: Estimated Creatinine Clearance: 61.2 mL/min (A) (by C-G formula based on SCr of 1.03 mg/dL (H)). Liver Function Tests: No results for input(s): AST, ALT, ALKPHOS, BILITOT, PROT, ALBUMIN in the last 168 hours. No results for input(s): LIPASE, AMYLASE in the last 168 hours. No results for input(s): AMMONIA in the last 168 hours. Coagulation Profile: No results for input(s): INR, PROTIME in the last 168  hours. Cardiac Enzymes:  Recent Labs Lab 08/11/16 2125 08/12/16 0219 08/12/16 0608  TROPONINI <0.03 <0.03 <0.03   BNP (last 3 results) No results for input(s): PROBNP in the last 8760 hours. HbA1C: No results for input(s): HGBA1C in the last 72 hours. CBG: No results for input(s): GLUCAP in the last 168 hours. Lipid Profile: No results for input(s): CHOL, HDL, LDLCALC, TRIG, CHOLHDL, LDLDIRECT in the last 72 hours. Thyroid Function Tests:  Recent Labs  08/11/16 2125  TSH 0.996   Anemia Panel: No results for input(s): VITAMINB12, FOLATE, FERRITIN, TIBC, IRON, RETICCTPCT in the last  72 hours. Sepsis Labs: No results for input(s): PROCALCITON, LATICACIDVEN in the last 168 hours.  No results found for this or any previous visit (from the past 240 hour(s)).       Radiology Studies: Mr Virgel Paling FU Contrast  Result Date: 08/12/2016 CLINICAL DATA:  56 y/o F; sudden onset dizziness, nausea, and right jaw pain. EXAM: MR HEAD WITHOUT CONTRAST MRA OF THE HEAD WITHOUT CONTRAST MRA OF THE NECK WITHOUT AND WITH CONTRAST TECHNIQUE: Multiplanar, multiecho pulse sequences of the brain surrounding structures were obtained without intravenous contrast. Angiographic images of the head were obtained using MRA technique without intravenous contrast. Angiographic images of the neck were obtained using MRA technique without and with intravenous contrast. CONTRAST:  9mL MULTIHANCE GADOBENATE DIMEGLUMINE 529 MG/ML IV SOLN COMPARISON:  None. FINDINGS: MR HEAD FINDINGS Brain: 8 mm T2 hyperintense and T1 isointense lesion within the right posterosuperior paramedian cerebellar hemisphere with surrounding region of T2 FLAIR hyperintensity extending throughout cerebellar white matter and into the vermis probably representing associated vasogenic edema. No susceptibility blooming on SWAN sequence. No reduced diffusion to suggest acute infarction. No abnormal susceptibility hypointensity to indicate intracranial hemorrhage. No hydrocephalus, extra-axial collection, or herniation. Few nonspecific T2 FLAIR hyperintense punctate foci in white matter are of unlikely clinical significance. Vascular: Normal flow voids. Skull and upper cervical spine: Normal marrow signal. Sinuses/Orbits: Small left maxillary sinus mucous retention cysts. Otherwise negative. Other: None. MRA HEAD FINDINGS Internal carotid arteries:  Patent. Anterior cerebral arteries:  Patent. Middle cerebral arteries: Patent. Anterior communicating artery: Patent. Posterior communicating arteries: Probable diminutive left posterior communicating  artery. No right posterior communicating artery identified, likely hypoplastic or absent. Posterior cerebral arteries:  Patent. Basilar artery:  Patent. Vertebral arteries:  Patent. No evidence of high-grade stenosis, large vessel occlusion, or aneurysm. MRA NECK FINDINGS Aortic arch: Patent. Right common carotid artery: Patent. Right internal carotid artery: Patent. Right vertebral artery: Patent. Left common carotid artery: Patent. Left Internal carotid artery: Patent. Left Vertebral artery: Patent. There is no evidence of high-grade stenosis, dissection, or aneurysm. Other: Nodules within the right lobe of the thyroid measuring up to 19 mm. IMPRESSION: 1. Round 8 mm lesion centered in the right paramedian superior cerebellar hemisphere. Surrounding edema in the right cerebellar hemisphere extends into the vermis. Findings suggest a neoplasm or possibly cystic infectious/inflammatory process. CT of the head with and without contrast or MRI of the head with contrast is recommended for further characterization. 2. No evidence for acute/early subacute infarction. No susceptibility hypointensity to indicate hemorrhage. 3. Normal MRA of the neck. 4. Normal MRA of the head. These results will be called to the ordering clinician or representative by the Radiologist Assistant, and communication documented in the PACS or zVision Dashboard. Electronically Signed   By: Kristine Garbe M.D.   On: 08/12/2016 00:54   Mr Angiogram Neck W Or Wo Contrast  Result Date: 08/12/2016 CLINICAL DATA:  56 y/o F; sudden onset dizziness, nausea, and right jaw pain. EXAM: MR HEAD WITHOUT CONTRAST MRA OF THE HEAD WITHOUT CONTRAST MRA OF THE NECK WITHOUT AND WITH CONTRAST TECHNIQUE: Multiplanar, multiecho pulse sequences of the brain surrounding structures were obtained without intravenous contrast. Angiographic images of the head were obtained using MRA technique without intravenous contrast. Angiographic images of the neck were  obtained using MRA technique without and with intravenous contrast. CONTRAST:  13mL MULTIHANCE GADOBENATE DIMEGLUMINE 529 MG/ML IV SOLN COMPARISON:  None. FINDINGS: MR HEAD FINDINGS Brain: 8 mm T2 hyperintense and T1 isointense lesion within the right posterosuperior paramedian cerebellar hemisphere with surrounding region of T2 FLAIR hyperintensity extending throughout cerebellar white matter and into the vermis probably representing associated vasogenic edema. No susceptibility blooming on SWAN sequence. No reduced diffusion to suggest acute infarction. No abnormal susceptibility hypointensity to indicate intracranial hemorrhage. No hydrocephalus, extra-axial collection, or herniation. Few nonspecific T2 FLAIR hyperintense punctate foci in white matter are of unlikely clinical significance. Vascular: Normal flow voids. Skull and upper cervical spine: Normal marrow signal. Sinuses/Orbits: Small left maxillary sinus mucous retention cysts. Otherwise negative. Other: None. MRA HEAD FINDINGS Internal carotid arteries:  Patent. Anterior cerebral arteries:  Patent. Middle cerebral arteries: Patent. Anterior communicating artery: Patent. Posterior communicating arteries: Probable diminutive left posterior communicating artery. No right posterior communicating artery identified, likely hypoplastic or absent. Posterior cerebral arteries:  Patent. Basilar artery:  Patent. Vertebral arteries:  Patent. No evidence of high-grade stenosis, large vessel occlusion, or aneurysm. MRA NECK FINDINGS Aortic arch: Patent. Right common carotid artery: Patent. Right internal carotid artery: Patent. Right vertebral artery: Patent. Left common carotid artery: Patent. Left Internal carotid artery: Patent. Left Vertebral artery: Patent. There is no evidence of high-grade stenosis, dissection, or aneurysm. Other: Nodules within the right lobe of the thyroid measuring up to 19 mm. IMPRESSION: 1. Round 8 mm lesion centered in the right  paramedian superior cerebellar hemisphere. Surrounding edema in the right cerebellar hemisphere extends into the vermis. Findings suggest a neoplasm or possibly cystic infectious/inflammatory process. CT of the head with and without contrast or MRI of the head with contrast is recommended for further characterization. 2. No evidence for acute/early subacute infarction. No susceptibility hypointensity to indicate hemorrhage. 3. Normal MRA of the neck. 4. Normal MRA of the head. These results will be called to the ordering clinician or representative by the Radiologist Assistant, and communication documented in the PACS or zVision Dashboard. Electronically Signed   By: Kristine Garbe M.D.   On: 08/12/2016 00:54   Mr Brain Wo Contrast  Result Date: 08/12/2016 CLINICAL DATA:  56 y/o F; sudden onset dizziness, nausea, and right jaw pain. EXAM: MR HEAD WITHOUT CONTRAST MRA OF THE HEAD WITHOUT CONTRAST MRA OF THE NECK WITHOUT AND WITH CONTRAST TECHNIQUE: Multiplanar, multiecho pulse sequences of the brain surrounding structures were obtained without intravenous contrast. Angiographic images of the head were obtained using MRA technique without intravenous contrast. Angiographic images of the neck were obtained using MRA technique without and with intravenous contrast. CONTRAST:  57mL MULTIHANCE GADOBENATE DIMEGLUMINE 529 MG/ML IV SOLN COMPARISON:  None. FINDINGS: MR HEAD FINDINGS Brain: 8 mm T2 hyperintense and T1 isointense lesion within the right posterosuperior paramedian cerebellar hemisphere with surrounding region of T2 FLAIR hyperintensity extending throughout cerebellar white matter and into the vermis probably representing associated vasogenic edema. No susceptibility blooming on SWAN sequence. No reduced diffusion to suggest acute infarction. No abnormal susceptibility hypointensity to indicate intracranial hemorrhage. No hydrocephalus, extra-axial collection, or herniation.  Few nonspecific T2 FLAIR  hyperintense punctate foci in white matter are of unlikely clinical significance. Vascular: Normal flow voids. Skull and upper cervical spine: Normal marrow signal. Sinuses/Orbits: Small left maxillary sinus mucous retention cysts. Otherwise negative. Other: None. MRA HEAD FINDINGS Internal carotid arteries:  Patent. Anterior cerebral arteries:  Patent. Middle cerebral arteries: Patent. Anterior communicating artery: Patent. Posterior communicating arteries: Probable diminutive left posterior communicating artery. No right posterior communicating artery identified, likely hypoplastic or absent. Posterior cerebral arteries:  Patent. Basilar artery:  Patent. Vertebral arteries:  Patent. No evidence of high-grade stenosis, large vessel occlusion, or aneurysm. MRA NECK FINDINGS Aortic arch: Patent. Right common carotid artery: Patent. Right internal carotid artery: Patent. Right vertebral artery: Patent. Left common carotid artery: Patent. Left Internal carotid artery: Patent. Left Vertebral artery: Patent. There is no evidence of high-grade stenosis, dissection, or aneurysm. Other: Nodules within the right lobe of the thyroid measuring up to 19 mm. IMPRESSION: 1. Round 8 mm lesion centered in the right paramedian superior cerebellar hemisphere. Surrounding edema in the right cerebellar hemisphere extends into the vermis. Findings suggest a neoplasm or possibly cystic infectious/inflammatory process. CT of the head with and without contrast or MRI of the head with contrast is recommended for further characterization. 2. No evidence for acute/early subacute infarction. No susceptibility hypointensity to indicate hemorrhage. 3. Normal MRA of the neck. 4. Normal MRA of the head. These results will be called to the ordering clinician or representative by the Radiologist Assistant, and communication documented in the PACS or zVision Dashboard. Electronically Signed   By: Kristine Garbe M.D.   On: 08/12/2016 00:54     Dg Chest Port 1 View  Result Date: 08/11/2016 CLINICAL DATA:  Sudden onset dizziness and shortness of Breath EXAM: PORTABLE CHEST 1 VIEW COMPARISON:  06/14/2005 FINDINGS: The heart size and mediastinal contours are within normal limits. Both lungs are clear. The visualized skeletal structures are unremarkable. IMPRESSION: No active disease. Electronically Signed   By: Inez Catalina M.D.   On: 08/11/2016 21:20        Scheduled Meds: . venlafaxine XR  75 mg Oral Q breakfast   Continuous Infusions: . sodium chloride 100 mL/hr at 08/12/16 0003     LOS: 0 days     Cordelia Poche, MD [facility] 08/12/2016, 9:48 AM Pager: [phone]  If 7PM-7AM, please contact night-coverage [email or web address] Password [facility] 08/12/2016, 9:48 AM

## 2016-08-12 NOTE — Progress Notes (Signed)
Pt admitted from ED. Pt lives at home with husband and children. Pt is A&Ox4. Pt's skin is warm, dry and intact. Pt oriented to room. Told to call for assistance.Pt placed on telemetry. Will continue to monitor pt. Ranelle Oyster, RN

## 2016-08-12 NOTE — Progress Notes (Signed)
Notified Dr. Hal Hope of pt's MRI of head: round 74mm lesion centered in the right paramedian superior cerebellar hemisphere at 0102. Dr. Hal Hope stated he talked to neurology and that neurology is coming to see pt. Ranelle Oyster, RN

## 2016-08-12 NOTE — Consult Note (Signed)
NEURO HOSPITALIST CONSULT NOTE   Requestig physician: Dr. Hal Hope  Reason for Consult: Right cerebellar mass lesion on MRI  History obtained from:  Patient and Chart     HPI:                                                                                                                                          Katrina Marquez is an 56 y.o. female who presented via EMS with sudden onset of non-spinning dizziness, nausea, vomiting, right sided headache and right jaw pain. She also endorses having had transient double vision. The symptoms began while she was working at her computer. Zofran was given by EMS with relief of her nausea.   MRI brain obtained in the ED revealed a small right cerebellar mass with surrounding edema.   History reviewed. No pertinent past medical history.  Past Surgical History:  Procedure Laterality Date  . MYOMECTOMY      Family History  Problem Relation Age of Onset  . CAD Mother   . CAD Father    Social History:  reports that she has never smoked. She has never used smokeless tobacco. She reports that she drinks alcohol. Her drug history is not on file.  Allergies  Allergen Reactions  . Fish Allergy Anaphylaxis  . Tomato Anaphylaxis    MEDICATIONS:                                                                                                                     Prior to Admission:  Prescriptions Prior to Admission  Medication Sig Dispense Refill Last Dose  . cyclobenzaprine (FLEXERIL) 10 MG tablet Take 10 mg by mouth 3 (three) times daily as needed for muscle spasms.   1 PRN at PRN  . venlafaxine XR (EFFEXOR-XR) 75 MG 24 hr capsule Take 75 mg by mouth daily.  1 08/11/2016 at am    ROS:  No chest pain. Positive for RUE weakness. No confusion or dysphasia. Positive for diaphoresis.   Blood  pressure 133/68, pulse 76, temperature 98.2 F (36.8 C), temperature source Oral, resp. rate 18, height 5\' 6"  (1.676 m), weight 71.8 kg (158 lb 3.2 oz), SpO2 100 %.  HEENT: [place]/AT Lungs: Respirations unlabored Ext: No edema  Neurological Examination Mental Status: Alert, oriented, thought content appropriate.  Speech fluent without evidence of aphasia.  Able to follow all commands without difficulty. Cranial Nerves: II: Visual fields intact. PERRL at 5 mm >> 4 mm  III,IV, VI: ptosis not present, EOMI without nystagmus V,VII: smile symmetric, decreased temperature sensation to right side of face VIII: hearing intact to conversation IX,X: no hypophonia XI: symmetric XII: midline tongue extension Motor: Right : Upper extremity   5/5    Left:     Upper extremity   5/5  Lower extremity   5/5     Lower extremity   5/5 Normal tone throughout; no atrophy noted Sensory: Temp and light touch intact x 4. No extinction Deep Tendon Reflexes: 2+ and symmetric throughout Plantars: Right: downgoing  Left: downgoing Cerebellar: No ataxia with FNF or RAM bilaterally Gait: Deferred   Lab Results: Basic Metabolic Panel:  Recent Labs Lab 08/11/16 1839  NA 138  K 4.0  CL 105  CO2 25  GLUCOSE 98  BUN 13  CREATININE 1.01*  CALCIUM 9.6    Liver Function Tests: No results for input(s): AST, ALT, ALKPHOS, BILITOT, PROT, ALBUMIN in the last 168 hours. No results for input(s): LIPASE, AMYLASE in the last 168 hours. No results for input(s): AMMONIA in the last 168 hours.  CBC:  Recent Labs Lab 08/11/16 1839  WBC 6.3  NEUTROABS 4.7  HGB 13.6  HCT 40.1  MCV 89.3  PLT 274    Cardiac Enzymes:  Recent Labs Lab 08/11/16 2125  TROPONINI <0.03    Lipid Panel: No results for input(s): CHOL, TRIG, HDL, CHOLHDL, VLDL, LDLCALC in the last 168 hours.  CBG: No results for input(s): GLUCAP in the last 168 hours.  Microbiology: No results found for this or any previous  visit.  Coagulation Studies: No results for input(s): LABPROT, INR in the last 72 hours.  Imaging: Mr Virgel [facility] Contrast  Result Date: 08/12/2016 CLINICAL DATA:  56 y/o F; sudden onset dizziness, nausea, and right jaw pain. EXAM: MR HEAD WITHOUT CONTRAST MRA OF THE HEAD WITHOUT CONTRAST MRA OF THE NECK WITHOUT AND WITH CONTRAST TECHNIQUE: Multiplanar, multiecho pulse sequences of the brain surrounding structures were obtained without intravenous contrast. Angiographic images of the head were obtained using MRA technique without intravenous contrast. Angiographic images of the neck were obtained using MRA technique without and with intravenous contrast. CONTRAST:  77mL MULTIHANCE GADOBENATE DIMEGLUMINE 529 MG/ML IV SOLN COMPARISON:  None. FINDINGS: MR HEAD FINDINGS Brain: 8 mm T2 hyperintense and T1 isointense lesion within the right posterosuperior paramedian cerebellar hemisphere with surrounding region of T2 FLAIR hyperintensity extending throughout cerebellar white matter and into the vermis probably representing associated vasogenic edema. No susceptibility blooming on SWAN sequence. No reduced diffusion to suggest acute infarction. No abnormal susceptibility hypointensity to indicate intracranial hemorrhage. No hydrocephalus, extra-axial collection, or herniation. Few nonspecific T2 FLAIR hyperintense punctate foci in white matter are of unlikely clinical significance. Vascular: Normal flow voids. Skull and upper cervical spine: Normal marrow signal. Sinuses/Orbits: Small left maxillary sinus mucous retention cysts. Otherwise negative. Other: None. MRA HEAD FINDINGS Internal carotid arteries:  Patent. Anterior cerebral arteries:  Patent. Middle cerebral arteries: Patent. Anterior communicating artery: Patent. Posterior communicating arteries: Probable diminutive left posterior communicating artery. No right posterior communicating artery identified, likely hypoplastic or absent. Posterior cerebral  arteries:  Patent. Basilar artery:  Patent. Vertebral arteries:  Patent. No evidence of high-grade stenosis, large vessel occlusion, or aneurysm. MRA NECK FINDINGS Aortic arch: Patent. Right common carotid artery: Patent. Right internal carotid artery: Patent. Right vertebral artery: Patent. Left common carotid artery: Patent. Left Internal carotid artery: Patent. Left Vertebral artery: Patent. There is no evidence of high-grade stenosis, dissection, or aneurysm. Other: Nodules within the right lobe of the thyroid measuring up to 19 mm. IMPRESSION: 1. Round 8 mm lesion centered in the right paramedian superior cerebellar hemisphere. Surrounding edema in the right cerebellar hemisphere extends into the vermis. Findings suggest a neoplasm or possibly cystic infectious/inflammatory process. CT of the head with and without contrast or MRI of the head with contrast is recommended for further characterization. 2. No evidence for acute/early subacute infarction. No susceptibility hypointensity to indicate hemorrhage. 3. Normal MRA of the neck. 4. Normal MRA of the head. These results will be called to the ordering clinician or representative by the Radiologist Assistant, and communication documented in the PACS or zVision Dashboard. Electronically Signed   By: Kristine Garbe M.D.   On: 08/12/2016 00:54   Mr Angiogram Neck W Or Wo Contrast  Result Date: 08/12/2016 CLINICAL DATA:  55 y/o F; sudden onset dizziness, nausea, and right jaw pain. EXAM: MR HEAD WITHOUT CONTRAST MRA OF THE HEAD WITHOUT CONTRAST MRA OF THE NECK WITHOUT AND WITH CONTRAST TECHNIQUE: Multiplanar, multiecho pulse sequences of the brain surrounding structures were obtained without intravenous contrast. Angiographic images of the head were obtained using MRA technique without intravenous contrast. Angiographic images of the neck were obtained using MRA technique without and with intravenous contrast. CONTRAST:  48mL MULTIHANCE GADOBENATE  DIMEGLUMINE 529 MG/ML IV SOLN COMPARISON:  None. FINDINGS: MR HEAD FINDINGS Brain: 8 mm T2 hyperintense and T1 isointense lesion within the right posterosuperior paramedian cerebellar hemisphere with surrounding region of T2 FLAIR hyperintensity extending throughout cerebellar white matter and into the vermis probably representing associated vasogenic edema. No susceptibility blooming on SWAN sequence. No reduced diffusion to suggest acute infarction. No abnormal susceptibility hypointensity to indicate intracranial hemorrhage. No hydrocephalus, extra-axial collection, or herniation. Few nonspecific T2 FLAIR hyperintense punctate foci in white matter are of unlikely clinical significance. Vascular: Normal flow voids. Skull and upper cervical spine: Normal marrow signal. Sinuses/Orbits: Small left maxillary sinus mucous retention cysts. Otherwise negative. Other: None. MRA HEAD FINDINGS Internal carotid arteries:  Patent. Anterior cerebral arteries:  Patent. Middle cerebral arteries: Patent. Anterior communicating artery: Patent. Posterior communicating arteries: Probable diminutive left posterior communicating artery. No right posterior communicating artery identified, likely hypoplastic or absent. Posterior cerebral arteries:  Patent. Basilar artery:  Patent. Vertebral arteries:  Patent. No evidence of high-grade stenosis, large vessel occlusion, or aneurysm. MRA NECK FINDINGS Aortic arch: Patent. Right common carotid artery: Patent. Right internal carotid artery: Patent. Right vertebral artery: Patent. Left common carotid artery: Patent. Left Internal carotid artery: Patent. Left Vertebral artery: Patent. There is no evidence of high-grade stenosis, dissection, or aneurysm. Other: Nodules within the right lobe of the thyroid measuring up to 19 mm. IMPRESSION: 1. Round 8 mm lesion centered in the right paramedian superior cerebellar hemisphere. Surrounding edema in the right cerebellar hemisphere extends into the  vermis. Findings suggest a neoplasm or possibly cystic infectious/inflammatory process. CT of the head with and without  contrast or MRI of the head with contrast is recommended for further characterization. 2. No evidence for acute/early subacute infarction. No susceptibility hypointensity to indicate hemorrhage. 3. Normal MRA of the neck. 4. Normal MRA of the head. These results will be called to the ordering clinician or representative by the Radiologist Assistant, and communication documented in the PACS or zVision Dashboard. Electronically Signed   By: Kristine Garbe M.D.   On: 08/12/2016 00:54   Mr Brain Wo Contrast  Result Date: 08/12/2016 CLINICAL DATA:  56 y/o F; sudden onset dizziness, nausea, and right jaw pain. EXAM: MR HEAD WITHOUT CONTRAST MRA OF THE HEAD WITHOUT CONTRAST MRA OF THE NECK WITHOUT AND WITH CONTRAST TECHNIQUE: Multiplanar, multiecho pulse sequences of the brain surrounding structures were obtained without intravenous contrast. Angiographic images of the head were obtained using MRA technique without intravenous contrast. Angiographic images of the neck were obtained using MRA technique without and with intravenous contrast. CONTRAST:  72mL MULTIHANCE GADOBENATE DIMEGLUMINE 529 MG/ML IV SOLN COMPARISON:  None. FINDINGS: MR HEAD FINDINGS Brain: 8 mm T2 hyperintense and T1 isointense lesion within the right posterosuperior paramedian cerebellar hemisphere with surrounding region of T2 FLAIR hyperintensity extending throughout cerebellar white matter and into the vermis probably representing associated vasogenic edema. No susceptibility blooming on SWAN sequence. No reduced diffusion to suggest acute infarction. No abnormal susceptibility hypointensity to indicate intracranial hemorrhage. No hydrocephalus, extra-axial collection, or herniation. Few nonspecific T2 FLAIR hyperintense punctate foci in white matter are of unlikely clinical significance. Vascular: Normal flow voids.  Skull and upper cervical spine: Normal marrow signal. Sinuses/Orbits: Small left maxillary sinus mucous retention cysts. Otherwise negative. Other: None. MRA HEAD FINDINGS Internal carotid arteries:  Patent. Anterior cerebral arteries:  Patent. Middle cerebral arteries: Patent. Anterior communicating artery: Patent. Posterior communicating arteries: Probable diminutive left posterior communicating artery. No right posterior communicating artery identified, likely hypoplastic or absent. Posterior cerebral arteries:  Patent. Basilar artery:  Patent. Vertebral arteries:  Patent. No evidence of high-grade stenosis, large vessel occlusion, or aneurysm. MRA NECK FINDINGS Aortic arch: Patent. Right common carotid artery: Patent. Right internal carotid artery: Patent. Right vertebral artery: Patent. Left common carotid artery: Patent. Left Internal carotid artery: Patent. Left Vertebral artery: Patent. There is no evidence of high-grade stenosis, dissection, or aneurysm. Other: Nodules within the right lobe of the thyroid measuring up to 19 mm. IMPRESSION: 1. Round 8 mm lesion centered in the right paramedian superior cerebellar hemisphere. Surrounding edema in the right cerebellar hemisphere extends into the vermis. Findings suggest a neoplasm or possibly cystic infectious/inflammatory process. CT of the head with and without contrast or MRI of the head with contrast is recommended for further characterization. 2. No evidence for acute/early subacute infarction. No susceptibility hypointensity to indicate hemorrhage. 3. Normal MRA of the neck. 4. Normal MRA of the head. These results will be called to the ordering clinician or representative by the Radiologist Assistant, and communication documented in the PACS or zVision Dashboard. Electronically Signed   By: Kristine Garbe M.D.   On: 08/12/2016 00:54   Dg Chest Port 1 View  Result Date: 08/11/2016 CLINICAL DATA:  Sudden onset dizziness and shortness of  Breath EXAM: PORTABLE CHEST 1 VIEW COMPARISON:  06/14/2005 FINDINGS: The heart size and mediastinal contours are within normal limits. Both lungs are clear. The visualized skeletal structures are unremarkable. IMPRESSION: No active disease. Electronically Signed   By: Inez Catalina M.D.   On: 08/11/2016 21:20    Assessment: 56 year old female with acute  non-spinning dizziness, nausea, vomiting and right occipital headache 1. MRI reveals a round 8 mm lesion centered in the right paramedian superior cerebellar hemisphere. Surrounding edema in the right cerebellar hemisphere extends into the vermis. Findings suggest a neoplasm or possibly cystic infectious/inflammatory process. If neoplasm, then primary brain tumor versus metastatic lesion will need to be differentiated.  2. No fever, white count or neck stiffness to suggest infection  Recommendations: 1. Send back to MRI for post-contrast images of the brain to assess for possible enhancement of the cerebellar lesion.  2. CT chest, abdomen and pelvis with contrast to assess for possible primary neoplasm 3. Neurosurgery consult for possible biopsy.  4. HIV testing  Electronically signed: Dr. Kerney Elbe 08/12/2016, 1:22 AM

## 2016-08-12 NOTE — Evaluation (Signed)
Physical Therapy Evaluation Patient Details Name: Katrina Marquez MRN: [id number] DOB: [birth year] Today's Date: 08/12/2016   History of Present Illness  Pt is a 56 yo female admitted through ED on 08/11/16 following an episode of dizziness, nausea and diaphoresis in which she could not get up from the floor. Pt was found to have an 46mm lesion in the right paramedian supervior cerebellar hemisphere into the vermis. Awaiting MRI results to make diagosis. PMH significant for depression, chronic next pain, chronic tinnitus.   Clinical Impression  Pt presents with the above diagnosis and below deficits for therapy evaluation. Prior to admission, pt lived with her husband and daughter and work from home. Pt was completely independent. Currently, pt requires supervision for mobility and is very guarded with her mobility. Pt will benefit from continued PT follow-up in order to address the below deficits prior to discharge home.     Follow Up Recommendations Outpatient PT    Equipment Recommendations  None recommended by PT    Recommendations for Other Services       Precautions / Restrictions Precautions Precautions: None Restrictions Weight Bearing Restrictions: No      Mobility  Bed Mobility Overal bed mobility: Independent                Transfers Overall transfer level: Needs assistance Equipment used: None Transfers: Sit to/from Stand Sit to Stand: Supervision         General transfer comment: Supervision for safety  Ambulation/Gait Ambulation/Gait assistance: Supervision Ambulation Distance (Feet): 40 Feet Assistive device: None Gait Pattern/deviations: Step-through pattern Gait velocity: decreased Gait velocity interpretation: Below normal speed for age/gender General Gait Details: slow cadence, good sequencing and step length. Pt is somewhat tentative with gait and will benefit from continued follow-up.   Stairs            Wheelchair Mobility     Modified Rankin (Stroke Patients Only)       Balance Overall balance assessment: Needs assistance Sitting-balance support: Feet supported;No upper extremity supported Sitting balance-Leahy Scale: Good     Standing balance support: No upper extremity supported Standing balance-Leahy Scale: Fair                               Pertinent Vitals/Pain Pain Assessment: 0-10 Pain Score: 2  Pain Location: posterior head Pain Descriptors / Indicators: Discomfort;Headache Pain Intervention(s): Monitored during session    Home Living Family/patient expects to be discharged to:: Private residence Living Arrangements: Spouse/significant other;Children Available Help at Discharge: Family;Available PRN/intermittently Type of Home: House Home Access: Stairs to enter Entrance Stairs-Rails: None Entrance Stairs-Number of Steps: 2-3 Home Layout: Two level;Full bath on main level Home Equipment: Hand held shower head      Prior Function Level of Independence: Independent         Comments: driving and working from home     Hand Dominance   Dominant Hand: Right    Extremity/Trunk Assessment   Upper Extremity Assessment Upper Extremity Assessment: Overall WFL for tasks assessed    Lower Extremity Assessment Lower Extremity Assessment: Overall WFL for tasks assessed    Cervical / Trunk Assessment Cervical / Trunk Assessment: Normal  Communication   Communication: No difficulties  Cognition Arousal/Alertness: Awake/alert Behavior During Therapy: WFL for tasks assessed/performed Overall Cognitive Status: Within Functional Limits for tasks assessed  General Comments      Exercises     Assessment/Plan    PT Assessment Patient needs continued PT services  PT Problem List Decreased activity tolerance;Decreased balance;Decreased mobility       PT Treatment Interventions Gait training;Stair  training;Functional mobility training;Therapeutic activities;Therapeutic exercise;Balance training    PT Goals (Current goals can be found in the Care Plan section)  Acute Rehab PT Goals Patient Stated Goal: to feel better and go home PT Goal Formulation: With patient Time For Goal Achievement: 08/19/16 Potential to Achieve Goals: Good    Frequency Min 3X/week   Barriers to discharge        Co-evaluation               AM-PAC PT "6 Clicks" Daily Activity  Outcome Measure Difficulty turning over in bed (including adjusting bedclothes, sheets and blankets)?: None Difficulty moving from lying on back to sitting on the side of the bed? : None Difficulty sitting down on and standing up from a chair with arms (e.g., wheelchair, bedside commode, etc,.)?: A Little Help needed moving to and from a bed to chair (including a wheelchair)?: A Little Help needed walking in hospital room?: A Little Help needed climbing 3-5 steps with a railing? : A Little 6 Click Score: 20    End of Session Equipment Utilized During Treatment: Gait belt Activity Tolerance: Patient tolerated treatment well Patient left: in bed;with call bell/phone within reach;with family/visitor present Nurse Communication: Mobility status PT Visit Diagnosis: Unsteadiness on feet (R26.81);Dizziness and giddiness (R42)    Time: 3335-4562 PT Time Calculation (min) (ACUTE ONLY): 17 min   Charges:   PT Evaluation $PT Eval Low Complexity: 1 Procedure     PT G Codes:   PT G-Codes **NOT FOR INPATIENT CLASS** Functional Assessment Tool Used: AM-PAC 6 Clicks Basic Mobility;Clinical judgement Functional Limitation: Mobility: Walking and moving around Mobility: Walking and Moving Around Current Status (B6389): At least 20 percent but less than 40 percent impaired, limited or restricted Mobility: Walking and Moving Around Goal Status [phone]): 0 percent impaired, limited or restricted    Scheryl Marten PT, DPT   [phone]   Shanon Rosser 08/12/2016, 1:41 PM

## 2016-08-13 DIAGNOSIS — G939 Disorder of brain, unspecified: Secondary | ICD-10-CM

## 2016-08-13 DIAGNOSIS — K7689 Other specified diseases of liver: Secondary | ICD-10-CM

## 2016-08-13 DIAGNOSIS — R9089 Other abnormal findings on diagnostic imaging of central nervous system: Secondary | ICD-10-CM | POA: Diagnosis not present

## 2016-08-13 DIAGNOSIS — N281 Cyst of kidney, acquired: Secondary | ICD-10-CM | POA: Diagnosis not present

## 2016-08-13 DIAGNOSIS — R42 Dizziness and giddiness: Secondary | ICD-10-CM | POA: Diagnosis not present

## 2016-08-13 DIAGNOSIS — G9389 Other specified disorders of brain: Secondary | ICD-10-CM | POA: Diagnosis not present

## 2016-08-13 MED ORDER — LIDOCAINE HCL (PF) 2 % IJ SOLN
10.0000 mL | Freq: Once | INTRAMUSCULAR | Status: DC
  Filled 2016-08-13: qty 10

## 2016-08-13 MED ORDER — LIDOCAINE 1% INJECTION FOR CIRCUMCISION
10.0000 mL | INJECTION | Freq: Once | INTRAVENOUS | Status: DC
  Filled 2016-08-13: qty 10

## 2016-08-13 NOTE — Discharge Instructions (Signed)
Katrina Marquez,  You were watched in the hospital with concern for a brain lesion. It is unclear as to what this lesion is specifically. Neurology evaluated you and recommended neurosurgery follow-up. Neurosurgeon, Dr. Christella Noa, evaluate general follow-up with you in the office.

## 2016-08-13 NOTE — Progress Notes (Signed)
Katrina Marquez to be D/C'd Home per MD order.  Discussed with the patient and all questions fully answered.  VSS, Skin clean, dry and intact without evidence of skin break down, no evidence of skin tears noted. IV catheter discontinued intact. Site without signs and symptoms of complications. Dressing and pressure applied.  An After Visit Summary was printed and given to the patient. Patient received prescription.  D/c education completed with patient/family including follow up instructions, medication list, d/c activities limitations if indicated, with other d/c instructions as indicated by MD - patient able to verbalize understanding, all questions fully answered.   Patient instructed to return to ED, call 911, or call MD for any changes in condition.   Patient refused to be escorted and D/C'd home via private auto.  [place] 08/13/2016 12:54 PM

## 2016-08-13 NOTE — Progress Notes (Addendum)
Neurology Progress Note  S:// No complaints. Resolved nausea, vomiting and diplopia. Comfortably eating breaksfast in bed.  O:// Vitals:   08/12/16 2250 08/13/16 0555  BP: 123/67 108/68  Pulse: 73 68  Resp: 16 16  Temp: 98.2 F (36.8 C) 97.8 F (36.6 C)   GENERAL: Awake, alert in NAD HEENT: - Normocephalic and atraumatic, dry mm, no LN++, no Thyromegally LUNGS - Clear to auscultation bilaterally with no wheezes CV - S1S2 RRR, no m/r/g, equal pulses bilaterally. ABDOMEN - Soft, nontender, nondistended with normoactive BS  NEURO:  Mental Status: AA&Ox3 Language: speech is clear  Naming, repetition, fluency, and comprehension intact. Cranial Nerves: PERRL 27mm/brisk. EOMI, visual fields full, no facial asymmetry, facial sensation intact, hearing intact, tongue/uvula/soft palate midline, normal sternocleidomastoid and trapezius muscle strength. No evidence of tongue atrophy or fibrillations Motor: 5/5 all over with normal bulk and tone Tone: is normal and bulk is normal Sensation- Intact to light touch bilaterally Coordination: FTN intact bilaterally, no ataxia in BLE. Gait- deferred  Labs CBC    Component Value Date/Time   WBC 5.8 08/12/2016 0608   RBC 4.29 08/12/2016 0608   HGB 13.1 08/12/2016 0608   HCT 38.7 08/12/2016 0608   PLT 299 08/12/2016 0608   MCV 90.2 08/12/2016 0608   MCH 30.5 08/12/2016 0608   MCHC 33.9 08/12/2016 0608   RDW 12.5 08/12/2016 0608   LYMPHSABS 1.3 08/11/2016 1839   MONOABS 0.3 08/11/2016 1839   EOSABS 0.0 08/11/2016 1839   BASOSABS 0.0 08/11/2016 1839   CMP     Component Value Date/Time   NA 140 08/12/2016 0608   K 3.8 08/12/2016 0608   CL 108 08/12/2016 0608   CO2 23 08/12/2016 0608   GLUCOSE 98 08/12/2016 0608   BUN 8 08/12/2016 0608   CREATININE 1.03 (H) 08/12/2016 0608   CALCIUM 9.1 08/12/2016 0608   GFRNONAA 59 (L) 08/12/2016 0608   GFRAA >60 08/12/2016 1191   HIV negative  Imaging MRI done with contrast. I reviewed the  images and discussed in person with the on-call radiologist. Shows a enhancing right cerebellar below the tentorium. Likley intra-axial. Official read pasted below MRI with contrast IMPRESSION: Heterogeneously contrast-enhancing lesion of the right posterior fossa, which is be based on or just beneath the right leaflet of the cerebellar tentorium. 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 It is difficult to be sure whether the lesion is intra-axial or extra-axial. This could be a dural-based metastasis from an unknown primary. The enhancement pattern is less homogeneous than is typically seen in meningiomas, but meningioma remains a possibility. If the lesion is intra-axial, parenchymal metastatic disease is a primary consideration. CSF sampling might be helpful to evaluate for possible infectious or inflammatory process. There was no abnormality on susceptibility weighted images, which would be expected if this were a cavernous angioma.  CT chest abdomen pelvis unremarkable for a primary source of malignancy.  A:// 55/F with no PMH who was seen for nausea, vomiting and headache. Found to have a enhancing right cerebellar mass with surrounding vasogenic edema. CT torso shows no evidence of a primary cancer. High differentials for a lesion in this locale is solitary metastatic lesion and hemangioblastoma. Less likely a meningioma. Appearance not likely of stroke.  Impression Right cerebellar lesion with surrounding vasogenic edema. Less likely stroke Evaluate for metastasis and primary source.  Recs:// -Neurosurgical consult/Neuro-onc consult -Will need LP. Will discuss with patient and primary hospitalist and  pursue if both agreeable. -Will need outpatient neurosurgical as well as neuro-oncological work up. -Dexamethasone for the vasogenic edema can be considered.   -- Amie Portland,  MD [facility] [phone]  If 7pm to 7am, please call on call as listed on AMION.   NEUROHospitalist ADDENDUM NSGY/Neuro-onc seeing patient. Recommend holding off on LP. Will follow with short term imaging follow up prior to doing LP. We will sign off at this time Please recall as needed.  Amie Portland, MD [facility] [phone]  If 7pm to 7am, please call on call as listed on AMION.

## 2016-08-13 NOTE — Discharge Summary (Signed)
Physician Discharge Summary  Katrina Marquez YPP:[id number] DOB: Sep 21, 1960 DOA: 08/11/2016  PCP: Benito Mccreedy, MD  Admit date: 08/11/2016 Discharge date: 08/13/2016  Admitted From: Home Disposition: Home  Recommendations for Outpatient Follow-up:  1. Follow up with PCP in 1 week 2. Follow up with Dr. Christella Noa for follow-up imaging  Home Health: None Equipment/Devices: None  Discharge Condition: Stable CODE STATUS: Full code Diet recommendation: Regular diet   Brief/Interim Summary:  Admission HPI written by Rise Patience, MD   Chief Complaint: Dizziness.  HPI: Katrina Marquez is a 56 y.o. female with history of depression and chronic neck pain was brought to the ER patient had sudden onset of dizziness nausea diaphoresis while working on her computer and 1:45 PM. Along with this patient also had right sided neck pain on the jugulodigastric area. This was different from her regular neck pain. Patient immediately tried to lie on the floor. After trying to get up again patient was feeling dizzy. At this point EMS was called and patient was brought to the ER.   ED Course: In the ER patient apparently nonfocal. But on trying to sit up patient felt dizzy. Patient states she has chronic tinnitus. Denies any difficulty hearing. Patient is being admitted for further observation.    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 Neurology consulted and recommended Neurosurgery consultation. Neurosurgery will follow-up as an outpatient. CT chest/abdomen/pelvis unremarkable for primary or metastatic disease.  Hepatic cysts Incidental finding. Recommend outpatient hepatic function testing. Outpatient follow-up  Kidney cyst Incidental. Asymptomatic. Outpatient follow-up  Depression Continued Effexor  Muscle spasm Continued Flexeril  Discharge Diagnoses:  Principal Problem:    Dizziness Active Problems:   Brain lesion   Kidney cysts   Hepatic cyst    Discharge Instructions   Allergies as of 08/13/2016      Reactions   Fish Allergy Anaphylaxis   Tomato Anaphylaxis      Medication List    TAKE these medications   cyclobenzaprine 10 MG tablet Commonly known as:  FLEXERIL Take 10 mg by mouth 3 (three) times daily as needed for muscle spasms.   venlafaxine XR 75 MG 24 hr capsule Commonly known as:  EFFEXOR-XR Take 75 mg by mouth daily.      Follow-up Information    Ashok Pall, MD Follow up in 6 week(s).   Specialty:  Neurosurgery Why:  call the office to make an appointment. i will arrange for a repeat mri.  Contact information: [address] [place] [address] [phone]          Allergies  Allergen Reactions  . Fish Allergy Anaphylaxis  . Tomato Anaphylaxis    Consultations:  Neurology  Neurosurgery   Procedures/Studies: Ct Chest W Contrast  Addendum Date: 08/12/2016   ADDENDUM REPORT: 08/12/2016 15:45 ADDENDUM: 13 x 22 mm hypodense, fluid attenuating mass adjacent to the left lateral margin of the kidney likely reflecting a small cyst. Electronically Signed   By: Kathreen Devoid   On: 08/12/2016 15:45   Result Date: 08/12/2016 CLINICAL DATA:  Nausea, sweats for 2 days EXAM: CT CHEST, ABDOMEN, AND PELVIS WITH CONTRAST TECHNIQUE: Multidetector CT imaging of the chest, abdomen and pelvis was performed following the standard protocol during bolus administration of intravenous contrast. CONTRAST:  157mL ISOVUE-300 IOPAMIDOL (ISOVUE-300) INJECTION 61% COMPARISON:  None. FINDINGS: CT CHEST FINDINGS Cardiovascular: No significant vascular findings. Normal heart size. No pericardial effusion. Mediastinum/Nodes: No enlarged mediastinal,  hilar, or axillary lymph nodes. Thyroid gland, trachea, and esophagus demonstrate no significant findings. Lungs/Pleura: Lungs are clear. No pleural effusion or pneumothorax.  Musculoskeletal: No chest wall mass or suspicious bone lesions identified. CT ABDOMEN PELVIS FINDINGS Hepatobiliary: Multiple hypodense hepatic masses with the largest measuring 12 mm likely reflecting cysts. No gallstones, gallbladder wall thickening, or biliary dilatation. Pancreas: Unremarkable. No pancreatic ductal dilatation or surrounding inflammatory changes. Spleen: Normal in size without focal abnormality. Adrenals/Urinary Tract: Adrenal glands are unremarkable. Kidneys are normal, without renal calculi, focal lesion, or hydronephrosis. Bladder is unremarkable. Stomach/Bowel: Stomach is within normal limits. Appendix appears normal. No evidence of bowel wall thickening, distention, or inflammatory changes. Vascular/Lymphatic: No significant vascular findings are present. No enlarged abdominal or pelvic lymph nodes. Reproductive: Uterus and bilateral adnexa are unremarkable. Other: No abdominal wall hernia or abnormality. Small amount pelvic free fluid. Musculoskeletal: No acute or significant osseous findings. IMPRESSION: 1. No evidence of malignancy involving the chest, abdomen or pelvis. Electronically Signed: By: Kathreen Devoid On: 08/12/2016 15:40   Mr Jodene Nam Head Wo Contrast  Result Date: 08/12/2016 CLINICAL DATA:  56 y/o F; sudden onset dizziness, nausea, and right jaw pain. EXAM: MR HEAD WITHOUT CONTRAST MRA OF THE HEAD WITHOUT CONTRAST MRA OF THE NECK WITHOUT AND WITH CONTRAST TECHNIQUE: Multiplanar, multiecho pulse sequences of the brain surrounding structures were obtained without intravenous contrast. Angiographic images of the head were obtained using MRA technique without intravenous contrast. Angiographic images of the neck were obtained using MRA technique without and with intravenous contrast. CONTRAST:  13mL MULTIHANCE GADOBENATE DIMEGLUMINE 529 MG/ML IV SOLN COMPARISON:  None. FINDINGS: MR HEAD FINDINGS Brain: 8 mm T2 hyperintense and T1 isointense lesion within the right posterosuperior  paramedian cerebellar hemisphere with surrounding region of T2 FLAIR hyperintensity extending throughout cerebellar white matter and into the vermis probably representing associated vasogenic edema. No susceptibility blooming on SWAN sequence. No reduced diffusion to suggest acute infarction. No abnormal susceptibility hypointensity to indicate intracranial hemorrhage. No hydrocephalus, extra-axial collection, or herniation. Few nonspecific T2 FLAIR hyperintense punctate foci in white matter are of unlikely clinical significance. Vascular: Normal flow voids. Skull and upper cervical spine: Normal marrow signal. Sinuses/Orbits: Small left maxillary sinus mucous retention cysts. Otherwise negative. Other: None. MRA HEAD FINDINGS Internal carotid arteries:  Patent. Anterior cerebral arteries:  Patent. Middle cerebral arteries: Patent. Anterior communicating artery: Patent. Posterior communicating arteries: Probable diminutive left posterior communicating artery. No right posterior communicating artery identified, likely hypoplastic or absent. Posterior cerebral arteries:  Patent. Basilar artery:  Patent. Vertebral arteries:  Patent. No evidence of high-grade stenosis, large vessel occlusion, or aneurysm. MRA NECK FINDINGS Aortic arch: Patent. Right common carotid artery: Patent. Right internal carotid artery: Patent. Right vertebral artery: Patent. Left common carotid artery: Patent. Left Internal carotid artery: Patent. Left Vertebral artery: Patent. There is no evidence of high-grade stenosis, dissection, or aneurysm. Other: Nodules within the right lobe of the thyroid measuring up to 19 mm. IMPRESSION: 1. Round 8 mm lesion centered in the right paramedian superior cerebellar hemisphere. Surrounding edema in the right cerebellar hemisphere extends into the vermis. Findings suggest a neoplasm or possibly cystic infectious/inflammatory process. CT of the head with and without contrast or MRI of the head with contrast  is recommended for further characterization. 2. No evidence for acute/early subacute infarction. No susceptibility hypointensity to indicate hemorrhage. 3. Normal MRA of the neck. 4. Normal MRA of the head. These results will be called to the ordering clinician or representative by the Radiologist Assistant,  and communication documented in the PACS or zVision Dashboard. Electronically Signed   By: Kristine Garbe M.D.   On: 08/12/2016 00:54   Mr Angiogram Neck W Or Wo Contrast  Result Date: 08/12/2016 CLINICAL DATA:  56 y/o F; sudden onset dizziness, nausea, and right jaw pain. EXAM: MR HEAD WITHOUT CONTRAST MRA OF THE HEAD WITHOUT CONTRAST MRA OF THE NECK WITHOUT AND WITH CONTRAST TECHNIQUE: Multiplanar, multiecho pulse sequences of the brain surrounding structures were obtained without intravenous contrast. Angiographic images of the head were obtained using MRA technique without intravenous contrast. Angiographic images of the neck were obtained using MRA technique without and with intravenous contrast. CONTRAST:  41mL MULTIHANCE GADOBENATE DIMEGLUMINE 529 MG/ML IV SOLN COMPARISON:  None. FINDINGS: MR HEAD FINDINGS Brain: 8 mm T2 hyperintense and T1 isointense lesion within the right posterosuperior paramedian cerebellar hemisphere with surrounding region of T2 FLAIR hyperintensity extending throughout cerebellar white matter and into the vermis probably representing associated vasogenic edema. No susceptibility blooming on SWAN sequence. No reduced diffusion to suggest acute infarction. No abnormal susceptibility hypointensity to indicate intracranial hemorrhage. No hydrocephalus, extra-axial collection, or herniation. Few nonspecific T2 FLAIR hyperintense punctate foci in white matter are of unlikely clinical significance. Vascular: Normal flow voids. Skull and upper cervical spine: Normal marrow signal. Sinuses/Orbits: Small left maxillary sinus mucous retention cysts. Otherwise negative. Other:  None. MRA HEAD FINDINGS Internal carotid arteries:  Patent. Anterior cerebral arteries:  Patent. Middle cerebral arteries: Patent. Anterior communicating artery: Patent. Posterior communicating arteries: Probable diminutive left posterior communicating artery. No right posterior communicating artery identified, likely hypoplastic or absent. Posterior cerebral arteries:  Patent. Basilar artery:  Patent. Vertebral arteries:  Patent. No evidence of high-grade stenosis, large vessel occlusion, or aneurysm. MRA NECK FINDINGS Aortic arch: Patent. Right common carotid artery: Patent. Right internal carotid artery: Patent. Right vertebral artery: Patent. Left common carotid artery: Patent. Left Internal carotid artery: Patent. Left Vertebral artery: Patent. There is no evidence of high-grade stenosis, dissection, or aneurysm. Other: Nodules within the right lobe of the thyroid measuring up to 19 mm. IMPRESSION: 1. Round 8 mm lesion centered in the right paramedian superior cerebellar hemisphere. Surrounding edema in the right cerebellar hemisphere extends into the vermis. Findings suggest a neoplasm or possibly cystic infectious/inflammatory process. CT of the head with and without contrast or MRI of the head with contrast is recommended for further characterization. 2. No evidence for acute/early subacute infarction. No susceptibility hypointensity to indicate hemorrhage. 3. Normal MRA of the neck. 4. Normal MRA of the head. These results will be called to the ordering clinician or representative by the Radiologist Assistant, and communication documented in the PACS or zVision Dashboard. Electronically Signed   By: Kristine Garbe M.D.   On: 08/12/2016 00:54   Mr Brain Wo Contrast  Result Date: 08/12/2016 CLINICAL DATA:  56 y/o F; sudden onset dizziness, nausea, and right jaw pain. EXAM: MR HEAD WITHOUT CONTRAST MRA OF THE HEAD WITHOUT CONTRAST MRA OF THE NECK WITHOUT AND WITH CONTRAST TECHNIQUE: Multiplanar,  multiecho pulse sequences of the brain surrounding structures were obtained without intravenous contrast. Angiographic images of the head were obtained using MRA technique without intravenous contrast. Angiographic images of the neck were obtained using MRA technique without and with intravenous contrast. CONTRAST:  40mL MULTIHANCE GADOBENATE DIMEGLUMINE 529 MG/ML IV SOLN COMPARISON:  None. FINDINGS: MR HEAD FINDINGS Brain: 8 mm T2 hyperintense and T1 isointense lesion within the right posterosuperior paramedian cerebellar hemisphere with surrounding region of T2 FLAIR hyperintensity extending throughout  cerebellar white matter and into the vermis probably representing associated vasogenic edema. No susceptibility blooming on SWAN sequence. No reduced diffusion to suggest acute infarction. No abnormal susceptibility hypointensity to indicate intracranial hemorrhage. No hydrocephalus, extra-axial collection, or herniation. Few nonspecific T2 FLAIR hyperintense punctate foci in white matter are of unlikely clinical significance. Vascular: Normal flow voids. Skull and upper cervical spine: Normal marrow signal. Sinuses/Orbits: Small left maxillary sinus mucous retention cysts. Otherwise negative. Other: None. MRA HEAD FINDINGS Internal carotid arteries:  Patent. Anterior cerebral arteries:  Patent. Middle cerebral arteries: Patent. Anterior communicating artery: Patent. Posterior communicating arteries: Probable diminutive left posterior communicating artery. No right posterior communicating artery identified, likely hypoplastic or absent. Posterior cerebral arteries:  Patent. Basilar artery:  Patent. Vertebral arteries:  Patent. No evidence of high-grade stenosis, large vessel occlusion, or aneurysm. MRA NECK FINDINGS Aortic arch: Patent. Right common carotid artery: Patent. Right internal carotid artery: Patent. Right vertebral artery: Patent. Left common carotid artery: Patent. Left Internal carotid artery: Patent.  Left Vertebral artery: Patent. There is no evidence of high-grade stenosis, dissection, or aneurysm. Other: Nodules within the right lobe of the thyroid measuring up to 19 mm. IMPRESSION: 1. Round 8 mm lesion centered in the right paramedian superior cerebellar hemisphere. Surrounding edema in the right cerebellar hemisphere extends into the vermis. Findings suggest a neoplasm or possibly cystic infectious/inflammatory process. CT of the head with and without contrast or MRI of the head with contrast is recommended for further characterization. 2. No evidence for acute/early subacute infarction. No susceptibility hypointensity to indicate hemorrhage. 3. Normal MRA of the neck. 4. Normal MRA of the head. These results will be called to the ordering clinician or representative by the Radiologist Assistant, and communication documented in the PACS or zVision Dashboard. Electronically Signed   By: Kristine Garbe M.D.   On: 08/12/2016 00:54   Mr Brain W Contrast  Result Date: 08/12/2016 CLINICAL DATA:  Cerebellar lesion seen on prior study EXAM: MRI HEAD WITH CONTRAST TECHNIQUE: Multiplanar, multiecho pulse sequences of the brain and surrounding structures were obtained with intravenous contrast. CONTRAST:  10 mL MultiHance COMPARISON:  Brain MRI 08/11/2016 FINDINGS: Brain: The midline structures are normal. There is no focal diffusion restriction to indicate acute infarct. There is a focal contrast-enhancing lesion at the superior right cerebellum that measures 10 x 9 mm. The lesion appears to be based on the right tentorial leaflet. No other contrast-enhancing lesions are identified. No intraparenchymal hematoma or chronic microhemorrhage. Brain volume is normal for age without age-advanced or lobar predominant atrophy. The dura is normal and there is no extra-axial collection. Vascular: Major intracranial arterial and venous sinus flow voids are preserved. Skull and upper cervical spine: The visualized  skull base, calvarium, upper cervical spine and extracranial soft tissues are normal. Sinuses/Orbits: No fluid levels or advanced mucosal thickening. No mastoid or middle ear effusion. Normal orbits. IMPRESSION: Heterogeneously contrast-enhancing lesion of the right posterior fossa, which is be based on or just beneath the right leaflet of the cerebellar tentorium. 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 It is difficult to be sure whether the lesion is intra-axial or extra-axial. This could be a dural-based metastasis from an unknown primary. The enhancement pattern is less homogeneous than is typically seen in meningiomas, but meningioma remains a possibility. If the lesion is intra-axial, parenchymal metastatic disease is a primary consideration. CSF sampling might be helpful to evaluate for possible infectious or inflammatory process. There  was no abnormality on susceptibility weighted images, which would be expected if this were a cavernous angioma. Electronically Signed   By: Ulyses Jarred M.D.   On: 08/12/2016 21:03   Ct Abdomen Pelvis W Contrast  Addendum Date: 08/12/2016   ADDENDUM REPORT: 08/12/2016 15:45 ADDENDUM: 13 x 22 mm hypodense, fluid attenuating mass adjacent to the left lateral margin of the kidney likely reflecting a small cyst. Electronically Signed   By: Kathreen Devoid   On: 08/12/2016 15:45   Result Date: 08/12/2016 CLINICAL DATA:  Nausea, sweats for 2 days EXAM: CT CHEST, ABDOMEN, AND PELVIS WITH CONTRAST TECHNIQUE: Multidetector CT imaging of the chest, abdomen and pelvis was performed following the standard protocol during bolus administration of intravenous contrast. CONTRAST:  142mL ISOVUE-300 IOPAMIDOL (ISOVUE-300) INJECTION 61% COMPARISON:  None. FINDINGS: CT CHEST FINDINGS Cardiovascular: No significant vascular findings. Normal heart size. No pericardial effusion. Mediastinum/Nodes: No  enlarged mediastinal, hilar, or axillary lymph nodes. Thyroid gland, trachea, and esophagus demonstrate no significant findings. Lungs/Pleura: Lungs are clear. No pleural effusion or pneumothorax. Musculoskeletal: No chest wall mass or suspicious bone lesions identified. CT ABDOMEN PELVIS FINDINGS Hepatobiliary: Multiple hypodense hepatic masses with the largest measuring 12 mm likely reflecting cysts. No gallstones, gallbladder wall thickening, or biliary dilatation. Pancreas: Unremarkable. No pancreatic ductal dilatation or surrounding inflammatory changes. Spleen: Normal in size without focal abnormality. Adrenals/Urinary Tract: Adrenal glands are unremarkable. Kidneys are normal, without renal calculi, focal lesion, or hydronephrosis. Bladder is unremarkable. Stomach/Bowel: Stomach is within normal limits. Appendix appears normal. No evidence of bowel wall thickening, distention, or inflammatory changes. Vascular/Lymphatic: No significant vascular findings are present. No enlarged abdominal or pelvic lymph nodes. Reproductive: Uterus and bilateral adnexa are unremarkable. Other: No abdominal wall hernia or abnormality. Small amount pelvic free fluid. Musculoskeletal: No acute or significant osseous findings. IMPRESSION: 1. No evidence of malignancy involving the chest, abdomen or pelvis. Electronically Signed: By: Kathreen Devoid On: 08/12/2016 15:40   Dg Chest Port 1 View  Result Date: 08/11/2016 CLINICAL DATA:  Sudden onset dizziness and shortness of Breath EXAM: PORTABLE CHEST 1 VIEW COMPARISON:  06/14/2005 FINDINGS: The heart size and mediastinal contours are within normal limits. Both lungs are clear. The visualized skeletal structures are unremarkable. IMPRESSION: No active disease. Electronically Signed   By: Inez Catalina M.D.   On: 08/11/2016 21:20      Subjective: No symptoms.  Discharge Exam: Vitals:   08/12/16 2250 08/13/16 0555  BP: 123/67 108/68  Pulse: 73 68  Resp: 16 16  Temp: 98.2  F (36.8 C) 97.8 F (36.6 C)   Vitals:   08/12/16 0541 08/12/16 1546 08/12/16 2250 08/13/16 0555  BP: 114/65 106/62 123/67 108/68  Pulse: 76 87 73 68  Resp: 18 16 16 16   Temp: 98.3 F (36.8 C) 98.6 F (37 C) 98.2 F (36.8 C) 97.8 F (36.6 C)  TempSrc: Oral Oral Oral Oral  SpO2: 99% 98% 97% 100%  Weight:      Height:        General: Pt is alert, awake, not in acute distress Cardiovascular: RRR, S1/S2 +, no rubs, no gallops Respiratory: CTA bilaterally, no wheezing, no rhonchi Abdominal: Soft, NT, ND, bowel sounds + Extremities: no edema, no cyanosis    The results of significant diagnostics from this hospitalization (including imaging, microbiology, ancillary and laboratory) are listed below for reference.     Microbiology: No results found for this or any previous visit (from the past 240 hour(s)).   Labs: Basic Metabolic  Panel:  Recent Labs Lab 08/11/16 1839 08/12/16 0608  NA 138 140  K 4.0 3.8  CL 105 108  CO2 25 23  GLUCOSE 98 98  BUN 13 8  CREATININE 1.01* 1.03*  CALCIUM 9.6 9.1   CBC:  Recent Labs Lab 08/11/16 1839 08/12/16 0608  WBC 6.3 5.8  NEUTROABS 4.7  --   HGB 13.6 13.1  HCT 40.1 38.7  MCV 89.3 90.2  PLT 274 299   Cardiac Enzymes:  Recent Labs Lab 08/11/16 2125 08/12/16 0219 08/12/16 0608  TROPONINI <0.03 <0.03 <0.03   D-Dimer  Recent Labs  08/11/16 2125  DDIMER 0.29   Thyroid function studies  Recent Labs  08/11/16 2125  TSH 0.996   Urinalysis    Component Value Date/Time   COLORURINE STRAW (A) 08/11/2016 2155   APPEARANCEUR CLEAR 08/11/2016 2155   LABSPEC 1.008 08/11/2016 2155   PHURINE 7.0 08/11/2016 2155   GLUCOSEU NEGATIVE 08/11/2016 2155   HGBUR NEGATIVE 08/11/2016 2155   BILIRUBINUR NEGATIVE 08/11/2016 2155   KETONESUR 20 (A) 08/11/2016 2155   PROTEINUR NEGATIVE 08/11/2016 2155   NITRITE NEGATIVE 08/11/2016 2155   LEUKOCYTESUR NEGATIVE 08/11/2016 2155    SIGNED:   Cordelia Poche, MD [facility] 08/13/2016, 11:41 AM Pager [phone]  If 7PM-7AM, please contact night-coverage [email or web address] Password [email or web address]

## 2016-08-18 ENCOUNTER — Other Ambulatory Visit: Payer: Self-pay | Admitting: Physician Assistant

## 2016-08-18 DIAGNOSIS — M542 Cervicalgia: Secondary | ICD-10-CM

## 2016-08-23 NOTE — ED Provider Notes (Signed)
[facility] DEPT Provider Note   CSN: [id number] Arrival date & time: 08/11/16  1735     History   Chief Complaint No chief complaint on file.   HPI Katrina Marquez is a 56 y.o. female.  HPI Patient presents to the emergency department with dizziness, nausea, and right jaw pain.  The patient states she was sitting at her computer when this occurred.  She states that she felt like her balance was off.  The patient states that nothing seemed to make the condition better or worse.  She states that she did not take any medications prior to arrival. The patient denies chest pain, shortness of breath, headache,blurred vision, neck pain, fever, cough, weakness, numbness,  anorexia, edema, abdominal pain, nausea, vomiting, diarrhea, rash, back pain, dysuria, hematemesis, bloody stool, near syncope, or syncope. History reviewed. No pertinent past medical history.  Patient Active Problem List   Diagnosis Date Noted  . Brain lesion 08/13/2016  . Kidney cysts 08/13/2016  . Hepatic cyst 08/13/2016  . Dizziness 08/11/2016    Past Surgical History:  Procedure Laterality Date  . MYOMECTOMY      OB History    No data available       Home Medications    Prior to Admission medications   Medication Sig Start Date End Date Taking? Authorizing Provider  cyclobenzaprine (FLEXERIL) 10 MG tablet Take 10 mg by mouth 3 (three) times daily as needed for muscle spasms.  07/27/16  Yes [provider]  venlafaxine XR (EFFEXOR-XR) 75 MG 24 hr capsule Take 75 mg by mouth daily. 07/27/16  Yes [provider]    Family History Family History  Problem Relation Age of Onset  . CAD Mother   . CAD Father     Social History Social History  Substance Use Topics  . Smoking status: Never Smoker  . Smokeless tobacco: Never Used  . Alcohol use Yes     Comment: Occasionally.     Allergies   Fish allergy and Tomato   Review of Systems Review of Systems All other systems  negative except as documented in the HPI. All pertinent positives and negatives as reviewed in the HPI.  Physical Exam Updated Vital Signs BP 108/68 (BP Location: Right Arm)   Pulse 68   Temp 97.8 F (36.6 C) (Oral)   Resp 16   Ht 5\' 6"  (1.676 m)   Wt 71.8 kg (158 lb 3.2 oz)   SpO2 100%   BMI 25.53 kg/m   Physical Exam  Constitutional: She is oriented to person, place, and time. She appears well-developed and well-nourished. No distress.  HENT:  Head: Normocephalic and atraumatic.  Mouth/Throat: Oropharynx is clear and moist.  Eyes: Pupils are equal, round, and reactive to light.  Neck: Normal range of motion. Neck supple.  Cardiovascular: Normal rate, regular rhythm and normal heart sounds.  Exam reveals no gallop and no friction rub.   No murmur heard. Pulmonary/Chest: Effort normal and breath sounds normal. No respiratory distress. She has no wheezes.  Abdominal: Soft. Bowel sounds are normal. She exhibits no distension. There is no tenderness.  Neurological: She is alert and oriented to person, place, and time. She has normal strength. No sensory deficit. She exhibits normal muscle tone. Coordination and gait normal. GCS eye subscore is 4. GCS verbal subscore is 5. GCS motor subscore is 6.  Skin: Skin is warm and dry. Capillary refill takes less than 2 seconds. No rash noted. No erythema.  Psychiatric: She has a  normal mood and affect. Her behavior is normal.  Nursing note and vitals reviewed.    ED Treatments / Results  Labs (all labs ordered are listed, but only abnormal results are displayed) Labs Reviewed  BASIC METABOLIC PANEL - Abnormal; Notable for the following:       Result Value   Creatinine, Ser 1.01 (*)    All other components within normal limits  URINALYSIS, ROUTINE W REFLEX MICROSCOPIC - Abnormal; Notable for the following:    Color, Urine STRAW (*)    Ketones, ur 20 (*)    All other components within normal limits  BASIC METABOLIC PANEL - Abnormal;  Notable for the following:    Creatinine, Ser 1.03 (*)    GFR calc non Af Amer 59 (*)    All other components within normal limits  CBC WITH DIFFERENTIAL/PLATELET  TSH  TROPONIN I  TROPONIN I  TROPONIN I  D-DIMER, QUANTITATIVE (NOT AT [facility])  HIV ANTIBODY (ROUTINE TESTING)  CBC  CYTOLOGY - NON PAP    EKG  EKG Interpretation None       Radiology No results found.  Procedures Procedures (including critical care time)  Medications Ordered in ED Medications  iopamidol (ISOVUE-300) 61 % injection 15 mL (not administered)  sodium chloride 0.9 % bolus 1,000 mL (0 mLs Intravenous Stopped 08/11/16 2107)  meclizine (ANTIVERT) tablet 25 mg (25 mg Oral Given 08/11/16 1904)  0.9 %  sodium chloride infusion ( Intravenous New Bag/Given 08/11/16 2106)  gadobenate dimeglumine (MULTIHANCE) injection 15 mL (15 mLs Intravenous Contrast Given 08/12/16 0015)  iopamidol (ISOVUE-300) 61 % injection (100 mLs  Contrast Given 08/12/16 1507)  gadobenate dimeglumine (MULTIHANCE) injection 10 mL (10 mLs Intravenous Contrast Given 08/12/16 2023)     Initial Impression / Assessment and Plan / ED Course  I have reviewed the triage vital signs and the nursing notes.  Pertinent labs & imaging results that were available during my care of the patient were reviewed by me and considered in my medical decision making (see chart for details).     The patient's primary physician, came to the emergency department.  He felt she needed to be admitted for further workup and observation of the symptoms.  Patient was started on IV fluids.  Patient is feeling some better at this time.  I spoke with the [facility], so we will admit the patient for further evaluation  Final Clinical Impressions(s) / ED Diagnoses   Final diagnoses:  SOB (shortness of breath)  CVA (cerebral vascular accident) ([place])  Abnormal finding on MRI of brain  Brain lesion    New Prescriptions Discharge Medication List as of  08/13/2016 12:14 PM       Dalia Heading, PA-C 08/23/16 0102    Charlesetta Shanks, MD 09/04/16 1439

## 2016-09-11 ENCOUNTER — Ambulatory Visit
Admission: RE | Admit: 2016-09-11 | Discharge: 2016-09-11 | Disposition: A | Discharge status: Home / Self Care | Payer: BLUE CROSS/BLUE SHIELD | Source: Ambulatory Visit | Attending: Physician Assistant | Admitting: Physician Assistant

## 2016-09-11 DIAGNOSIS — M542 Cervicalgia: Secondary | ICD-10-CM

## 2016-09-20 ENCOUNTER — Other Ambulatory Visit: Payer: Self-pay | Admitting: Neurosurgery

## 2016-09-20 DIAGNOSIS — G939 Disorder of brain, unspecified: Secondary | ICD-10-CM

## 2016-09-30 ENCOUNTER — Ambulatory Visit
Admission: RE | Admit: 2016-09-30 | Discharge: 2016-09-30 | Disposition: A | Discharge status: Home / Self Care | Payer: BLUE CROSS/BLUE SHIELD | Source: Ambulatory Visit | Attending: Neurosurgery | Admitting: Neurosurgery

## 2016-09-30 DIAGNOSIS — G939 Disorder of brain, unspecified: Secondary | ICD-10-CM

## 2016-09-30 MED ORDER — GADOBENATE DIMEGLUMINE 529 MG/ML IV SOLN
15.0000 mL | Freq: Once | INTRAVENOUS | Status: AC | PRN
  Administered 2016-09-30: 14 mL via INTRAVENOUS

## 2016-10-02 ENCOUNTER — Other Ambulatory Visit: Payer: BLUE CROSS/BLUE SHIELD

## 2016-10-04 DIAGNOSIS — D432 Neoplasm of uncertain behavior of brain, unspecified: Secondary | ICD-10-CM | POA: Insufficient documentation

## 2017-03-15 ENCOUNTER — Other Ambulatory Visit: Payer: Self-pay | Admitting: Neurosurgery

## 2017-03-15 DIAGNOSIS — D432 Neoplasm of uncertain behavior of brain, unspecified: Secondary | ICD-10-CM

## 2017-04-01 ENCOUNTER — Ambulatory Visit
Admission: RE | Admit: 2017-04-01 | Discharge: 2017-04-01 | Disposition: A | Discharge status: Home / Self Care | Payer: BLUE CROSS/BLUE SHIELD | Source: Ambulatory Visit | Attending: Neurosurgery | Admitting: Neurosurgery

## 2017-04-01 DIAGNOSIS — D432 Neoplasm of uncertain behavior of brain, unspecified: Secondary | ICD-10-CM

## 2017-04-01 MED ORDER — GADOBENATE DIMEGLUMINE 529 MG/ML IV SOLN
17.0000 mL | Freq: Once | INTRAVENOUS | Status: AC | PRN
  Administered 2017-04-01: 17 mL via INTRAVENOUS

## 2017-10-24 ENCOUNTER — Other Ambulatory Visit: Payer: Self-pay | Admitting: Endocrinology

## 2017-10-24 DIAGNOSIS — E041 Nontoxic single thyroid nodule: Secondary | ICD-10-CM

## 2017-11-09 ENCOUNTER — Other Ambulatory Visit: Payer: BLUE CROSS/BLUE SHIELD

## 2017-11-10 ENCOUNTER — Ambulatory Visit
Admission: RE | Admit: 2017-11-10 | Discharge: 2017-11-10 | Disposition: A | Discharge status: Home / Self Care | Payer: BLUE CROSS/BLUE SHIELD | Source: Ambulatory Visit | Attending: Endocrinology | Admitting: Endocrinology

## 2017-11-10 DIAGNOSIS — E041 Nontoxic single thyroid nodule: Secondary | ICD-10-CM

## 2018-03-12 ENCOUNTER — Other Ambulatory Visit: Payer: Self-pay | Admitting: Neurosurgery

## 2018-03-12 DIAGNOSIS — D432 Neoplasm of uncertain behavior of brain, unspecified: Secondary | ICD-10-CM

## 2018-04-03 ENCOUNTER — Ambulatory Visit
Admission: RE | Admit: 2018-04-03 | Discharge: 2018-04-03 | Disposition: A | Discharge status: Home / Self Care | Payer: BLUE CROSS/BLUE SHIELD | Source: Ambulatory Visit | Attending: Neurosurgery | Admitting: Neurosurgery

## 2018-04-03 ENCOUNTER — Other Ambulatory Visit: Payer: Self-pay

## 2018-04-03 DIAGNOSIS — D432 Neoplasm of uncertain behavior of brain, unspecified: Secondary | ICD-10-CM

## 2018-04-03 MED ORDER — GADOBENATE DIMEGLUMINE 529 MG/ML IV SOLN
15.0000 mL | Freq: Once | INTRAVENOUS | Status: AC | PRN
  Administered 2018-04-03: 15 mL via INTRAVENOUS

## 2018-05-22 IMAGING — US US THYROID
1 series · 14 of 25 positions shown · non-contrast
Comparison: None.

CLINICAL DATA: Nodule, left neck fullness x1 year

EXAM:
THYROID ULTRASOUND
TECHNIQUE: Ultrasound examination of the thyroid gland and adjacent soft
tissues was performed.

[Series 1: us thyroid · 0.06mm/px · 14 of 60 slices shown]
[im 1/60]
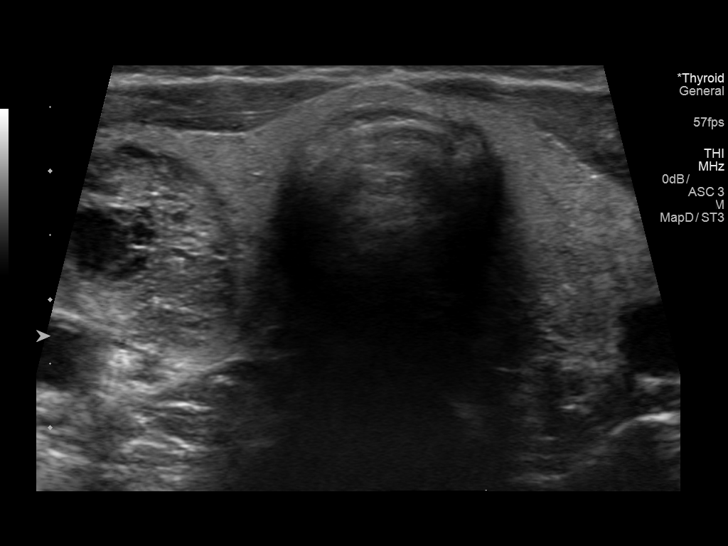
[im 5/60]
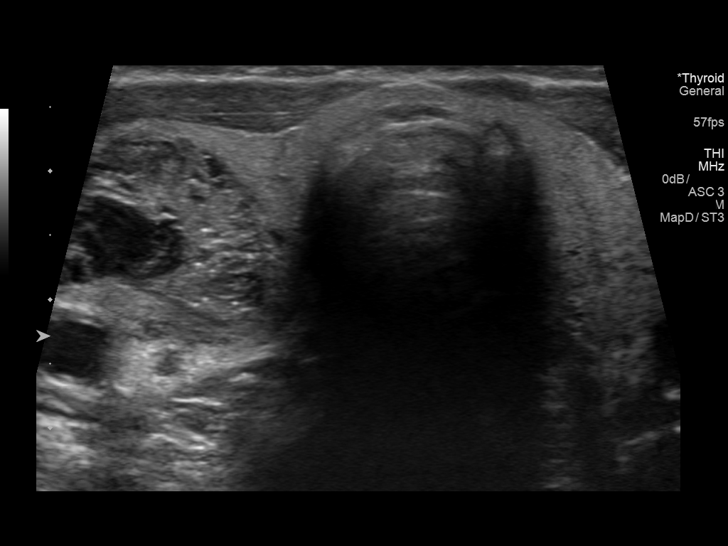
[im 10/60]
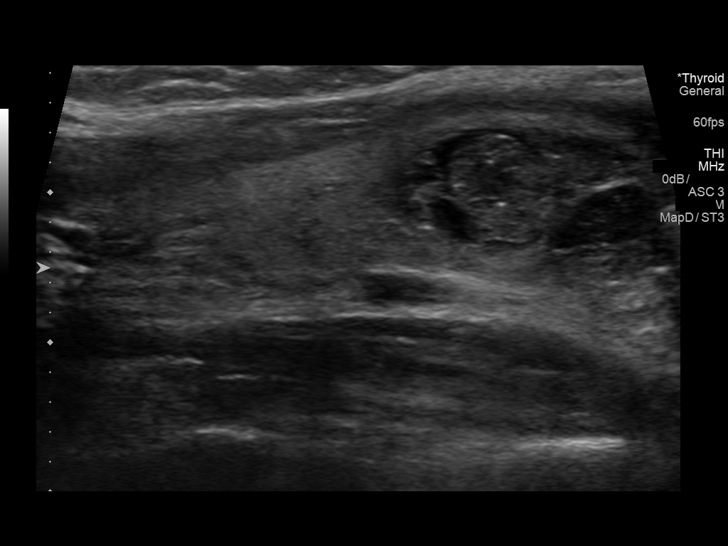
[im 15/60]
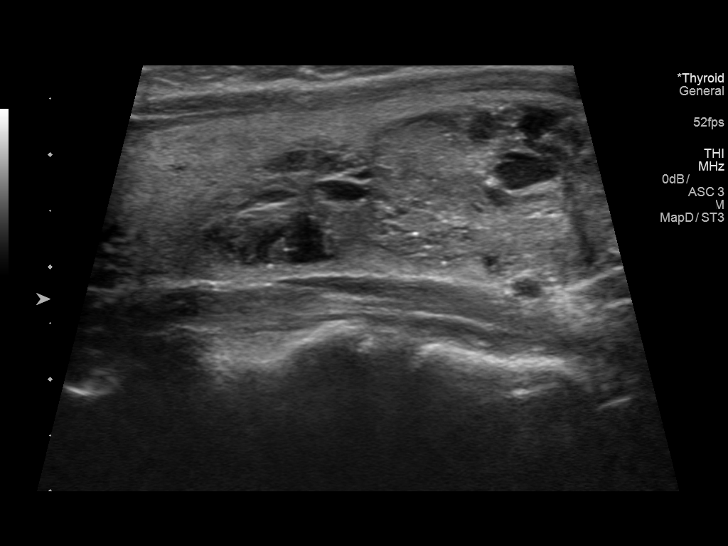
[im 20/60]
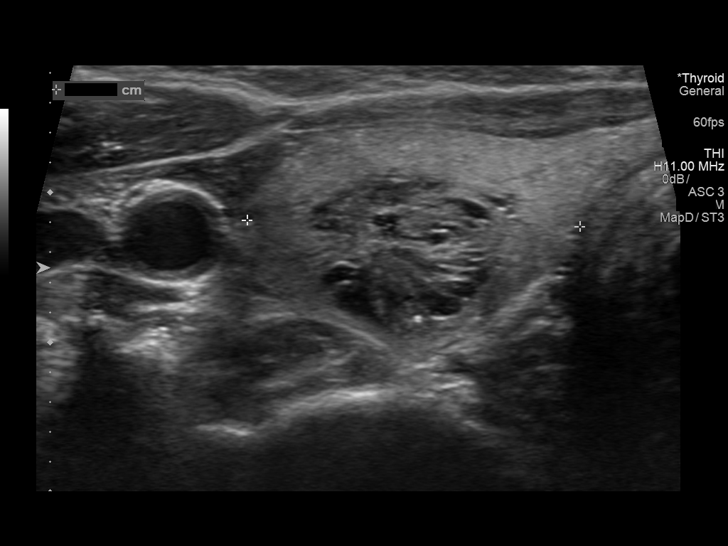
[im 23/60]
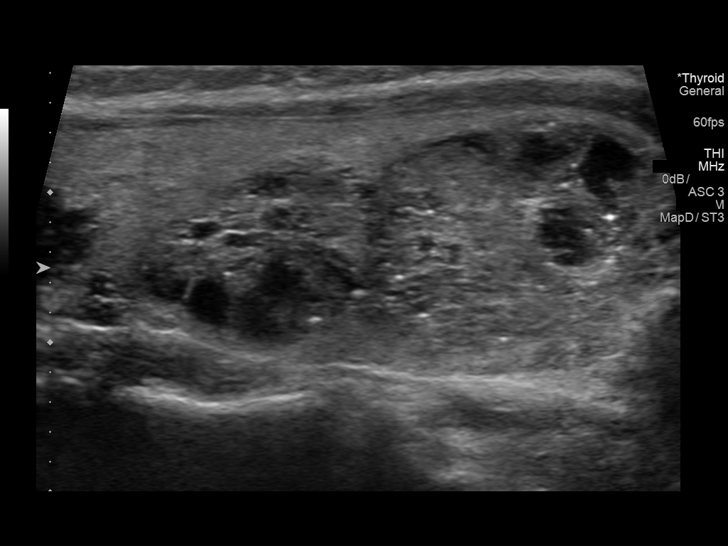
[im 28/60]
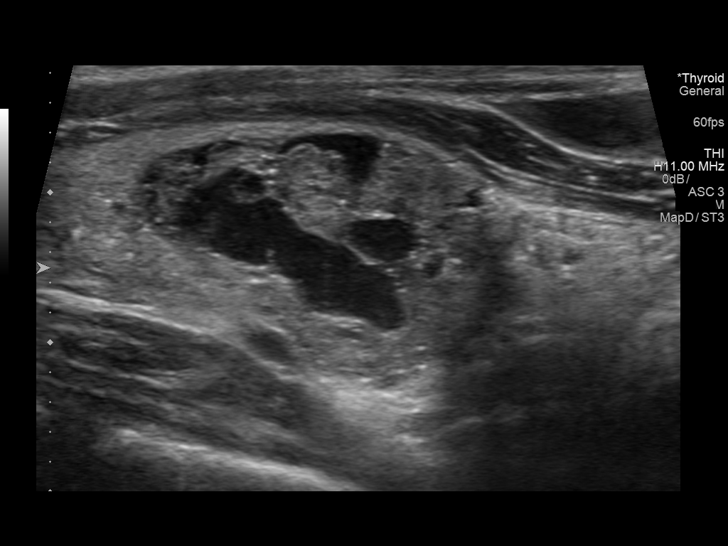
[im 32/60]
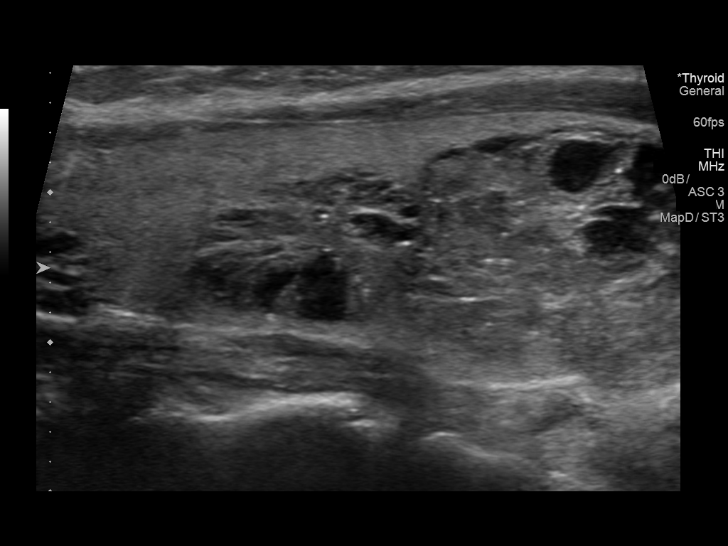
[im 37/60]
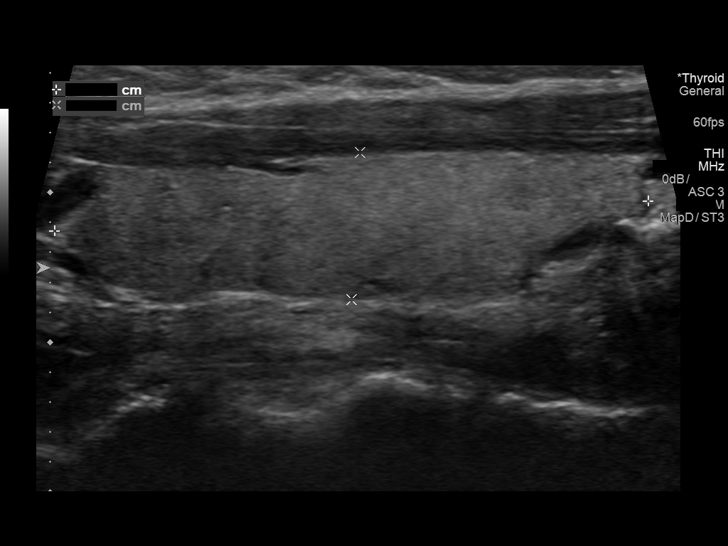
[im 40/60]
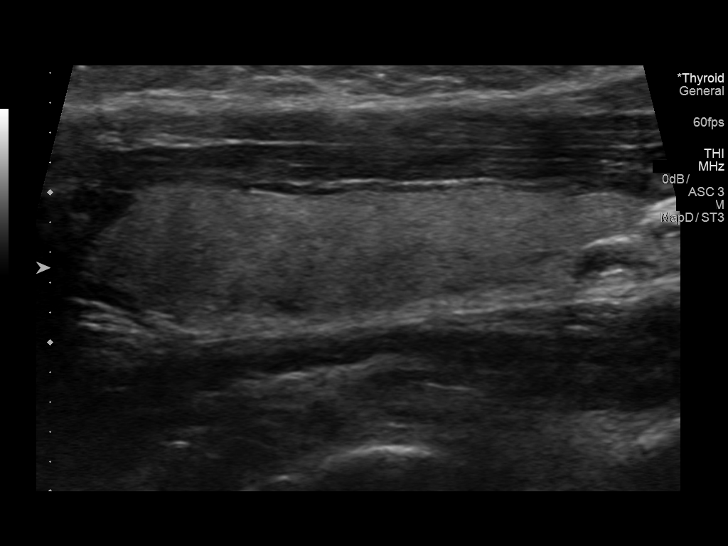
[im 45/60]
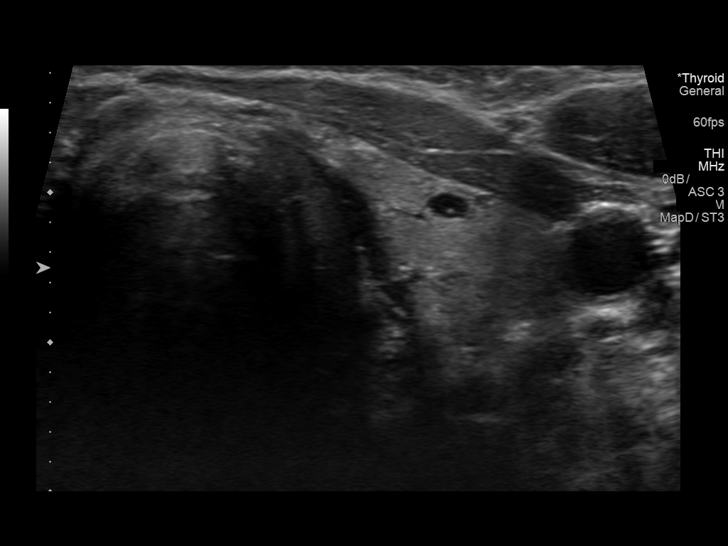
[im 50/60]
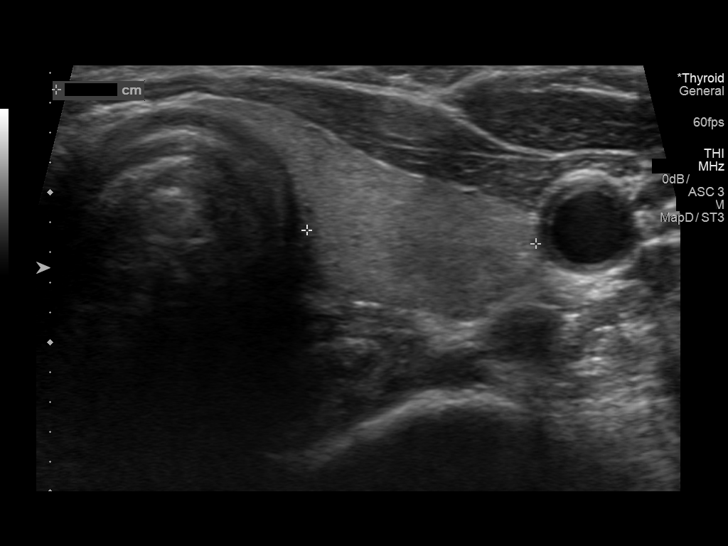
[im 55/60]
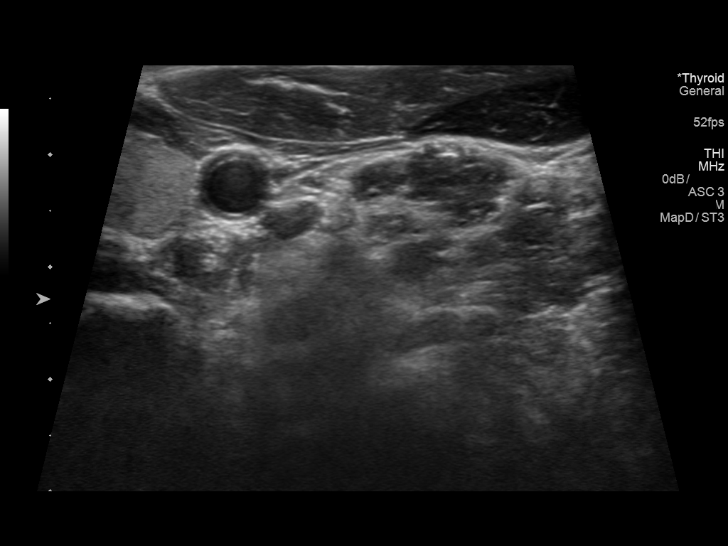
[im 60/60]
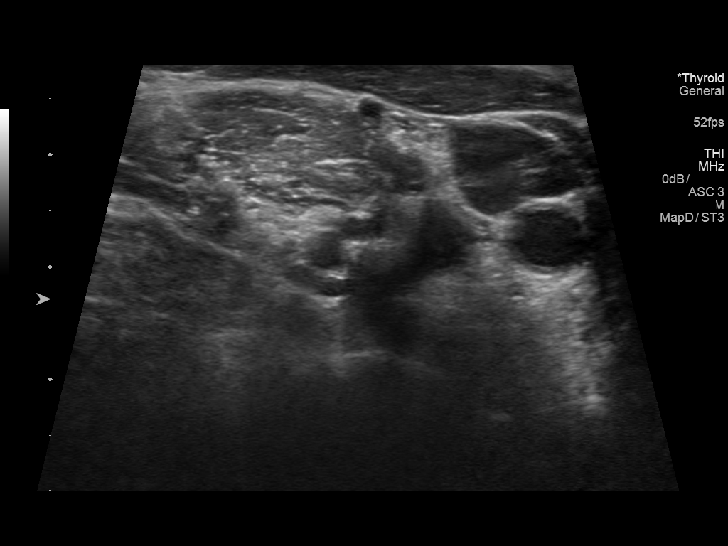

[14 of 25 positions shown; findings below may reference images not displayed]

FINDINGS: Right thyroid lobe

Measurements: 5.7 x 1.6 x 2.2 cm. Fairly homogeneous background
echotexture. Complex mostly solid 1.7 x 1.3 x 1.3 cm nodule, mid
lobe. Adjacent mostly solid 2.6 x 1.7 x 1.8 cm nodule with punctate
echogenic foci, inferior pole.

Left thyroid lobe

Measurements: 4 x 1 x 1.5 cm.  0.3 cm cyst, superior pole

Isthmus

Thickness: 0.2 cm.  No nodules visualized.

Lymphadenopathy

None visualized.
IMPRESSION: 1. Thyromegaly with 2 right nodules, both of which meet consensus
criteria for biopsy. Ultrasound-guided fine needle aspiration should
be considered, as per the consensus statement: Management of Thyroid
Nodules Detected at US: Society of Radiologists in Ultrasound

## 2018-08-03 ENCOUNTER — Other Ambulatory Visit: Payer: Self-pay | Admitting: Endocrinology

## 2018-08-03 DIAGNOSIS — R221 Localized swelling, mass and lump, neck: Secondary | ICD-10-CM

## 2018-09-11 ENCOUNTER — Ambulatory Visit
Admission: RE | Admit: 2018-09-11 | Discharge: 2018-09-11 | Disposition: A | Discharge status: Home / Self Care | Payer: BC Managed Care – PPO | Source: Ambulatory Visit | Attending: Endocrinology | Admitting: Endocrinology

## 2018-09-11 DIAGNOSIS — R221 Localized swelling, mass and lump, neck: Secondary | ICD-10-CM

## 2018-09-11 MED ORDER — IOPAMIDOL (ISOVUE-300) INJECTION 61%
75.0000 mL | Freq: Once | INTRAVENOUS | Status: AC | PRN
  Administered 2018-09-11: 75 mL via INTRAVENOUS

## 2018-11-01 ENCOUNTER — Ambulatory Visit: Payer: BC Managed Care – PPO

## 2018-11-01 ENCOUNTER — Encounter: Payer: BC Managed Care – PPO | Admitting: Podiatry

## 2018-11-01 DIAGNOSIS — M722 Plantar fascial fibromatosis: Secondary | ICD-10-CM

## 2018-11-01 NOTE — Progress Notes (Signed)
This encounter was created in error - please disregard.

## 2018-11-13 ENCOUNTER — Ambulatory Visit (INDEPENDENT_AMBULATORY_CARE_PROVIDER_SITE_OTHER): Payer: BC Managed Care – PPO

## 2018-11-13 ENCOUNTER — Ambulatory Visit: Payer: BC Managed Care – PPO | Admitting: Podiatry

## 2018-11-13 ENCOUNTER — Other Ambulatory Visit: Payer: Self-pay

## 2018-11-13 VITALS — BP 177/114 | HR 83

## 2018-11-13 DIAGNOSIS — M21622 Bunionette of left foot: Secondary | ICD-10-CM

## 2018-11-13 DIAGNOSIS — M2012 Hallux valgus (acquired), left foot: Secondary | ICD-10-CM

## 2018-11-13 DIAGNOSIS — M722 Plantar fascial fibromatosis: Secondary | ICD-10-CM

## 2018-11-13 NOTE — Patient Instructions (Signed)
Pre-Operative Instructions  Congratulations, you have decided to take an important step towards improving your quality of life.  You can be assured that the doctors and staff at [facility] will be with you every step of the way.  Here are some important things you should know:  1. Plan to be at the surgery center/hospital at least 1 (one) hour prior to your scheduled time, unless otherwise directed by the surgical center/hospital staff.  You must have a responsible adult accompany you, remain during the surgery and drive you home.  Make sure you have directions to the surgical center/hospital to ensure you arrive on time. 2. If you are having surgery at [facility] or  hospitals, you will need a copy of your medical history and physical form from your family physician within one month prior to the date of surgery. We will give you a form for your primary physician to complete.  3. We make every effort to accommodate the date you request for surgery.  However, there are times where surgery dates or times have to be moved.  We will contact you as soon as possible if a change in schedule is required.   4. No aspirin/ibuprofen for one week before surgery.  If you are on aspirin, any non-steroidal anti-inflammatory medications (Mobic, Aleve, Ibuprofen) should not be taken seven (7) days prior to your surgery.  You make take Tylenol for pain prior to surgery.  5. Medications - If you are taking daily heart and blood pressure medications, seizure, reflux, allergy, asthma, anxiety, pain or diabetes medications, make sure you notify the surgery center/hospital before the day of surgery so they can tell you which medications you should take or avoid the day of surgery. 6. No food or drink after midnight the night before surgery unless directed otherwise by surgical center/hospital staff. 7. No alcoholic beverages 24-hours prior to surgery.  No smoking 24-hours prior or 24-hours after  surgery. 8. Wear loose pants or shorts. They should be loose enough to fit over bandages, boots, and casts. 9. Don't wear slip-on shoes. Sneakers are preferred. 10. Bring your boot with you to the surgery center/hospital.  Also bring crutches or a walker if your physician has prescribed it for you.  If you do not have this equipment, it will be provided for you after surgery. 11. If you have not been contacted by the surgery center/hospital by the day before your surgery, call to confirm the date and time of your surgery. 12. Leave-time from work may vary depending on the type of surgery you have.  Appropriate arrangements should be made prior to surgery with your employer. 13. Prescriptions will be provided immediately following surgery by your doctor.  Fill these as soon as possible after surgery and take the medication as directed. Pain medications will not be refilled on weekends and must be approved by the doctor. 14. Remove nail polish on the operative foot and avoid getting pedicures prior to surgery. 15. Wash the night before surgery.  The night before surgery wash the foot and leg well with water and the antibacterial soap provided. Be sure to pay special attention to beneath the toenails and in between the toes.  Wash for at least three (3) minutes. Rinse thoroughly with water and dry well with a towel.  Perform this wash unless told not to do so by your physician.  Enclosed: 1 Ice pack (please put in freezer the night before surgery)   1 Hibiclens skin cleaner     Pre-op instructions  If you have any questions regarding the instructions, please do not hesitate to call our office.  [place]: [address], [place], [place] [address] -- [phone]  [place]: [address] [place], [place] [address] -- [phone]  [place]: [address]  [place], [place] [address] -- [phone]   Website: [email or web address] 

## 2018-11-14 DIAGNOSIS — M79676 Pain in unspecified toe(s): Secondary | ICD-10-CM

## 2018-11-14 NOTE — Progress Notes (Signed)
Subjective:  Patient ID: RMONI MCLAUCHLIN, female    DOB: [birth year],  MRN: [id number] HPI Chief Complaint  Patient presents with  . New Patient (Initial Visit)    left foot pain that throbs constantly. has been dealing with it for over a year now.   . Foot Pain    plantar midfoot arch area , feels as if its a nodule that has grown since the last year.     58 y.o. female presents with the above complaint.   ROS: Denies fever chills nausea vomiting muscle aches pains calf pain back pain chest pain shortness of breath.  No past medical history on file. Past Surgical History:  Procedure Laterality Date  . MYOMECTOMY      Current Outpatient Medications:  .  cyclobenzaprine (FLEXERIL) 10 MG tablet, Take 10 mg by mouth 3 (three) times daily as needed for muscle spasms. , Disp: , Rfl: 1 .  hydrOXYzine (VISTARIL) 25 MG capsule, , Disp: , Rfl:  .  phentermine 37.5 MG capsule, Take by mouth daily., Disp: , Rfl:  .  REXULTI 1 MG TABS tablet, Take 1 mg by mouth daily., Disp: , Rfl:  .  venlafaxine XR (EFFEXOR-XR) 75 MG 24 hr capsule, Take 75 mg by mouth daily., Disp: , Rfl: 1  Allergies  Allergen Reactions  . Fish Allergy Anaphylaxis  . Tomato Anaphylaxis   Review of Systems Objective:   Vitals:   11/13/18 1635  BP: (!) 177/114  Pulse: 83    General: Well developed, nourished, in no acute distress, alert and oriented x3   Dermatological: Skin is warm, dry and supple bilateral. Nails x 10 are well maintained; remaining integument appears unremarkable at this time. There are no open sores, no preulcerative lesions, no rash or signs of infection present.  Vascular: Dorsalis Pedis artery and Posterior Tibial artery pedal pulses are 2/4 bilateral with immedate capillary fill time. Pedal hair growth present. No varicosities and no lower extremity edema present bilateral.   Neruologic: Grossly intact via light touch bilateral. Vibratory intact via tuning fork bilateral. Protective  threshold with Semmes Wienstein monofilament intact to all pedal sites bilateral. Patellar and Achilles deep tendon reflexes 2+ bilateral. No Babinski or clonus noted bilateral.   Musculoskeletal: No gross boney pedal deformities bilateral. No pain, crepitus, or limitation noted with foot and ankle range of motion bilateral. Muscular strength 5/5 in all groups tested bilateral.  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  There is some dorsal spurring and medial spurring around the first metatarsophalangeal joints that really does not limit the range of motion but is painful with shoe gear.  She has pain on palpation to these areas and forced range of motion.  Increase in the first intermetatarsal angle is obvious as well as the fourth intermetatarsal angle.  Gait: Unassisted, Nonantalgic.    Radiographs:  Radiographs taken today demonstrate an osseously mature individual no acute findings.  Early osteoarthritic changes with some osseous hypertrophy around the first metatarsophalangeal joint and a tailor's bunion deformity bilateral.  Plantar fibroma left foot does not demonstrate any calcification.  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  Plantar fibroma left foot.  Plan: We discussed the etiology pathology conservative versus surgical therapies.  At this point she would like to go ahead and have surgical intervention consisting of an [facility] bunion repair left and the fifth metatarsal osteotomy left with excision of plantar fibroma left we consented her for the surgery  today coming up in the near future.  We did discuss the possible postop complications which may include but not limited to postop pain bleeding swelling infection recurrence need for further surgery overcorrection under correction also digit loss of limb loss of life.  She understands that she will need to be nonweightbearing for a period of  21 days because of the plantar incision.  She understands this and is amenable to it.  We provided her with instructions regarding the surgery center anesthesia group and information for instructions the morning of surgery.  We will consent her for the contralateral foot at her first postop visit.  We dispensed a Cam walker today.     Nataniel Gasper T. [place], [place]

## 2018-11-16 ENCOUNTER — Telehealth: Payer: Self-pay | Admitting: Podiatry

## 2018-11-16 NOTE — Telephone Encounter (Signed)
DOS: 11/23/2018  SURGICAL PROCEDURES: Altamese Askov [id number]), Metatarsal Osteotomy 5th [id number]), Plantar Fibroma [id number]) CPT CODES: M20.12(Hallux Abducto Valgus, M20.12(Tailors Bunion), M72.2(Plantar Fibromatosis)  Member Information   Member Number: [id number]  Policy Effective : [id number]  -  01/16/9998   Name: Katrina Marquez Date of Birth: 01-31-1960  Member Liability Summary       In-Network   Max Per Benefit Period Year-to-Date Remaining     CoInsurance         Deductible $700.00      Out-Of-Pocket 3 $4,000.00 $2,612.23 3 Out-of-Pocket includes copay, deductible, and coinsurance.   Hospital - Ambulatory Surgical          In-Network Copay Coinsurance Authorization Required Not Applicable [id number]  No Messages: OUT-OF-POCKET 100 PERCENT THEREAFTER

## 2018-11-21 ENCOUNTER — Telehealth: Payer: Self-pay | Admitting: Podiatry

## 2018-11-21 NOTE — Telephone Encounter (Signed)
I will confirm with you once Katrina Marquez confirms 11/30/2018 surgery. If I have not heard from her after lunch I will reach back out to her.

## 2018-11-21 NOTE — Telephone Encounter (Signed)
Patient just responded and she has confirmed the surgery date of 11/30/2018. She is no longer scheduled for this Friday so you do not have any surgeries for Friday now.

## 2018-11-21 NOTE — Telephone Encounter (Signed)
Sent pt a message via MyChart letting her know I moved her surgery from 11/23/2018 to 11/30/2018. I have moved her on Dr. Stephenie Acres schedule in [place] and I've contacted Caren Griffins at the surgical center. I will get her postop visits rescheduled as well.

## 2018-11-21 NOTE — Telephone Encounter (Signed)
Just let me know. I need to know so that I can cancel my assistant.

## 2018-11-21 NOTE — Telephone Encounter (Signed)
I'm scheduled for surgery this Friday, 11/23/2018. I'm unable to get off of work but I would be able to have surgery next Friday, 11/30/2018 as I will be off of work the whole following week. Please contact me, its better to e-mail me at [email or web address] or message me through [place]. Thank you.

## 2018-11-29 ENCOUNTER — Encounter: Payer: BC Managed Care – PPO | Admitting: Podiatry

## 2018-11-29 ENCOUNTER — Other Ambulatory Visit: Payer: Self-pay | Admitting: Podiatry

## 2018-11-29 MED ORDER — OXYCODONE-ACETAMINOPHEN 10-325 MG PO TABS: 1.0000 | ORAL_TABLET | ORAL | 0 refills | Status: DC | PRN

## 2018-11-29 MED ORDER — CEPHALEXIN 500 MG PO CAPS: 500.0000 mg | ORAL_CAPSULE | Freq: Three times a day (TID) | ORAL | 0 refills | Status: DC

## 2018-11-29 MED ORDER — ONDANSETRON HCL 4 MG PO TABS: 4.0000 mg | ORAL_TABLET | Freq: Three times a day (TID) | ORAL | 0 refills | Status: DC | PRN

## 2018-12-04 ENCOUNTER — Telehealth: Payer: Self-pay | Admitting: Podiatry

## 2018-12-04 NOTE — Telephone Encounter (Signed)
Pt is requesting a handicap placard for surgery scheduled for this Friday and for Friday 04 December. I told the pt I would check with Dr. Milinda Pointer how long we can write that for and she would need to come by and pick it up. Pt also stated she ordered crutches and a boot to wear to take a shower. I told her she would be able to walk, but the first week especially we would only want her up 15 minutes every hour to help keep postop swelling down and then when she came for her first postop visit Dr. Milinda Pointer would discuss her weightbearing status moving forward. Also told pt the boot is not to get wet, and if she could hang her foot over the edge of the shower/tub with a garbage bag on it or it would be better to take a bird bath until she is told its okay to get her surgery site wet.

## 2018-12-05 NOTE — Telephone Encounter (Signed)
Called pt to let her know that her handicap placard is ready for her to pick up at the front desk. Pt asked about her surgery time and I told her she would get a call today or tomorrow from the surgical center letting her know what time to arrive.

## 2018-12-05 NOTE — Telephone Encounter (Signed)
I'm working on it, yes ma'am.

## 2018-12-06 ENCOUNTER — Telehealth: Payer: Self-pay | Admitting: Podiatry

## 2018-12-06 ENCOUNTER — Other Ambulatory Visit: Payer: Self-pay | Admitting: Podiatry

## 2018-12-06 ENCOUNTER — Encounter: Payer: BC Managed Care – PPO | Admitting: Podiatry

## 2018-12-06 MED ORDER — ONDANSETRON HCL 4 MG PO TABS: 4.0000 mg | ORAL_TABLET | Freq: Three times a day (TID) | ORAL | 0 refills | Status: DC | PRN

## 2018-12-06 MED ORDER — CEPHALEXIN 500 MG PO CAPS: 500.0000 mg | ORAL_CAPSULE | Freq: Three times a day (TID) | ORAL | 0 refills | Status: DC

## 2018-12-06 MED ORDER — OXYCODONE-ACETAMINOPHEN 10-325 MG PO TABS: 1.0000 | ORAL_TABLET | Freq: Three times a day (TID) | ORAL | 0 refills | Status: AC | PRN

## 2018-12-06 NOTE — Telephone Encounter (Signed)
Pt called wanting to know what time we close as she forgot to pick up her handicap placard. I told her we close at 5 pm but if she could be here by 5:30 pm today someone would be here to give it to her. Stated if she could not come today, that her driver could come tomorrow either while she was in surgery or after to pickup the handicap placard.  Made Shelly aware pt would be here around 5:30 today to pickup handicap placard.

## 2018-12-07 ENCOUNTER — Encounter: Payer: Self-pay | Admitting: Podiatry

## 2018-12-07 DIAGNOSIS — M2012 Hallux valgus (acquired), left foot: Secondary | ICD-10-CM | POA: Diagnosis not present

## 2018-12-07 DIAGNOSIS — M21542 Acquired clubfoot, left foot: Secondary | ICD-10-CM | POA: Diagnosis not present

## 2018-12-07 DIAGNOSIS — D492 Neoplasm of unspecified behavior of bone, soft tissue, and skin: Secondary | ICD-10-CM | POA: Diagnosis not present

## 2018-12-11 ENCOUNTER — Encounter: Payer: BC Managed Care – PPO | Admitting: Podiatry

## 2018-12-12 ENCOUNTER — Ambulatory Visit (INDEPENDENT_AMBULATORY_CARE_PROVIDER_SITE_OTHER): Payer: BC Managed Care – PPO

## 2018-12-12 ENCOUNTER — Ambulatory Visit (INDEPENDENT_AMBULATORY_CARE_PROVIDER_SITE_OTHER): Payer: BC Managed Care – PPO | Admitting: Podiatry

## 2018-12-12 ENCOUNTER — Other Ambulatory Visit: Payer: Self-pay

## 2018-12-12 ENCOUNTER — Telehealth: Payer: Self-pay | Admitting: Podiatry

## 2018-12-12 DIAGNOSIS — M722 Plantar fascial fibromatosis: Secondary | ICD-10-CM

## 2018-12-12 NOTE — Telephone Encounter (Signed)
DOS: 12/21/2018  SURGICAL PROCEDURES: Altamese Benson [id number]) and Metatarsal Osteotomy 5th [id number])  BCBS Policy Effective : [id number]  -  01/16/9998  Member Liability Summary       In-Network   Max Per Benefit Period Year-to-Date Remaining     CoInsurance         Deductible $700.00      Out-Of-Pocket 3 $4,000.00 $2,501.93 3 Out-of-Pocket includes copay, deductible, and coinsurance.   Hospital - Ambulatory Surgical      In-Network Copay Coinsurance Authorization Required Not Applicable [id number]  No Messages: OUT-OF-POCKET 100 PERCENT THEREAFTER

## 2018-12-15 ENCOUNTER — Encounter: Payer: Self-pay | Admitting: Podiatry

## 2018-12-15 NOTE — Progress Notes (Signed)
Subjective:  Patient ID: Katrina Marquez, female    DOB: 10-13-1960,  MRN: [id number]  Chief Complaint  Patient presents with  . Routine Post Op    pov#1 dos 11.20.2020 [facility], Metatarsal Osteotomy 5th Lt, Plantar Fibroma Lt, pt states that the left foot is doing a lot better, pt also states that she has stopped taking pain meds, pt also shows no signs of infection     58 y.o. female returns for post-op check.  Patient is doing well.  She denies any pain.  Patient stopped taking pain medication she does not want to become reliant on.  Patient states the boot is helping.  She is denies any signs of clinical infection.  Review of Systems: Negative except as noted in the HPI. Denies N/V/F/Ch.  No past medical history on file.  Current Outpatient Medications:  .  cephALEXin (KEFLEX) 500 MG capsule, Take 1 capsule (500 mg total) by mouth 3 (three) times daily., Disp: 30 capsule, Rfl: 0 .  cephALEXin (KEFLEX) 500 MG capsule, Take 1 capsule (500 mg total) by mouth 3 (three) times daily., Disp: 30 capsule, Rfl: 0 .  cyclobenzaprine (FLEXERIL) 10 MG tablet, Take 10 mg by mouth 3 (three) times daily as needed for muscle spasms. , Disp: , Rfl: 1 .  hydrOXYzine (VISTARIL) 25 MG capsule, , Disp: , Rfl:  .  ondansetron (ZOFRAN) 4 MG tablet, Take 1 tablet (4 mg total) by mouth every 8 (eight) hours as needed., Disp: 20 tablet, Rfl: 0 .  ondansetron (ZOFRAN) 4 MG tablet, Take 1 tablet (4 mg total) by mouth every 8 (eight) hours as needed., Disp: 20 tablet, Rfl: 0 .  oxyCODONE-acetaminophen (PERCOCET) 10-325 MG tablet, Take 1 tablet by mouth every 4 (four) hours as needed for pain., Disp: 30 tablet, Rfl: 0 .  phentermine 37.5 MG capsule, Take by mouth daily., Disp: , Rfl:  .  REXULTI 1 MG TABS tablet, Take 1 mg by mouth daily., Disp: , Rfl:  .  valACYclovir (VALTREX) 1000 MG tablet, Take 1,000 mg by mouth daily., Disp: , Rfl:  .  venlafaxine XR (EFFEXOR-XR) 75 MG 24 hr capsule, Take 75 mg  by mouth daily., Disp: , Rfl: 1  Social History   Tobacco Use  Smoking Status Never Smoker  Smokeless Tobacco Never Used    Allergies  Allergen Reactions  . Fish Allergy Anaphylaxis  . Tomato Anaphylaxis   Objective:  There were no vitals filed for this visit. There is no height or weight on file to calculate BMI. Constitutional Well developed. Well nourished.  Vascular Foot warm and well perfused. Capillary refill normal to all digits.   Neurologic Normal speech. Oriented to person, place, and time. Epicritic sensation to light touch grossly present bilaterally.  Dermatologic Skin healing well without signs of infection. Skin edges well coapted without signs of infection.  Orthopedic: Tenderness to palpation noted about the surgical site.   Radiographs: 2 views of skeletally mature adult foot: Hardware is intact without any signs of loosening or backing out noted.  Good correction and alignment noted. Assessment:   1. Plantar fascial fibromatosis of left foot    Plan:  Patient was evaluated and treated and all questions answered.  S/p foot surgery left -Progressing as expected post-operatively. -XR: See above -WB Status: Weightbearing as tolerated to the left lower extremity with a cam boot -Sutures: Sutures are intact without any signs of infection.  Suture will be removed during next visit.  No dehiscence noted. -Medications:  None -Foot redressed.  No follow-ups on file.

## 2018-12-17 ENCOUNTER — Encounter: Payer: Self-pay | Admitting: Podiatry

## 2018-12-19 ENCOUNTER — Telehealth: Payer: Self-pay | Admitting: *Deleted

## 2018-12-19 ENCOUNTER — Other Ambulatory Visit: Payer: Self-pay | Admitting: Podiatry

## 2018-12-19 MED ORDER — ONDANSETRON HCL 4 MG PO TABS: 4.0000 mg | ORAL_TABLET | Freq: Three times a day (TID) | ORAL | 0 refills | Status: DC | PRN

## 2018-12-19 MED ORDER — CEPHALEXIN 500 MG PO CAPS: 500.0000 mg | ORAL_CAPSULE | Freq: Three times a day (TID) | ORAL | 0 refills | Status: DC

## 2018-12-19 MED ORDER — OXYCODONE-ACETAMINOPHEN 10-325 MG PO TABS: 1.0000 | ORAL_TABLET | Freq: Three times a day (TID) | ORAL | 0 refills | Status: AC | PRN

## 2018-12-19 NOTE — Telephone Encounter (Signed)
-----   Message from Garrel Ridgel, [place] sent at 12/19/2018  6:57 AM EST ----- Benign plantar fibroma

## 2018-12-19 NOTE — Telephone Encounter (Signed)
I informed pt of Dr. Stephenie Acres review of results.

## 2018-12-20 ENCOUNTER — Ambulatory Visit (INDEPENDENT_AMBULATORY_CARE_PROVIDER_SITE_OTHER): Payer: BC Managed Care – PPO | Admitting: Podiatry

## 2018-12-20 ENCOUNTER — Encounter: Payer: BC Managed Care – PPO | Admitting: Podiatry

## 2018-12-20 ENCOUNTER — Other Ambulatory Visit: Payer: Self-pay

## 2018-12-20 DIAGNOSIS — Z9889 Other specified postprocedural states: Secondary | ICD-10-CM

## 2018-12-21 ENCOUNTER — Encounter: Payer: Self-pay | Admitting: Podiatry

## 2018-12-21 DIAGNOSIS — M2011 Hallux valgus (acquired), right foot: Secondary | ICD-10-CM | POA: Diagnosis not present

## 2018-12-21 DIAGNOSIS — M21541 Acquired clubfoot, right foot: Secondary | ICD-10-CM | POA: Diagnosis not present

## 2018-12-22 NOTE — Progress Notes (Signed)
She presents today date of surgery 12/07/2018 [facility] bunionectomy left fifth metatarsal osteotomy left and excision of plantar fibroma left.  She denies fever chills nausea vomiting muscle aches and pains presents today with her cam walker walking without crutches or knee scooter.  She denies getting the area wet.  Objective: Dressings have been changed since I placed them last.  Areas are dry and clean.  There is moderate pitting edema no cellulitis drainage or odor.  Sutures are intact margins well coapted good range of motion of the first metatarsophalangeal joint.  Plantar incision appears to be healing very well no signs of purulence.  Assessment: Well-healing surgical foot.  Plan: We will remove sutures tomorrow in surgery at least the dorsal sutures need 1 more week for the plantar sutures.  Surgery we performed on the contralateral foot consisting of an [place] bunion repair and 1/5 metatarsal osteotomy tomorrow at [facility].

## 2018-12-25 ENCOUNTER — Encounter: Payer: BC Managed Care – PPO | Admitting: Podiatry

## 2018-12-27 ENCOUNTER — Encounter: Payer: BC Managed Care – PPO | Admitting: Podiatry

## 2018-12-27 ENCOUNTER — Encounter: Payer: Self-pay | Admitting: Podiatry

## 2018-12-27 ENCOUNTER — Ambulatory Visit (INDEPENDENT_AMBULATORY_CARE_PROVIDER_SITE_OTHER): Payer: BC Managed Care – PPO | Admitting: Podiatry

## 2018-12-27 ENCOUNTER — Other Ambulatory Visit: Payer: Self-pay

## 2018-12-27 ENCOUNTER — Ambulatory Visit (INDEPENDENT_AMBULATORY_CARE_PROVIDER_SITE_OTHER): Payer: BC Managed Care – PPO

## 2018-12-27 VITALS — BP 101/83 | HR 89 | Resp 16

## 2018-12-27 DIAGNOSIS — M2011 Hallux valgus (acquired), right foot: Secondary | ICD-10-CM

## 2018-12-27 DIAGNOSIS — Z9889 Other specified postprocedural states: Secondary | ICD-10-CM

## 2018-12-27 DIAGNOSIS — M21622 Bunionette of left foot: Secondary | ICD-10-CM

## 2018-12-27 DIAGNOSIS — M722 Plantar fascial fibromatosis: Secondary | ICD-10-CM

## 2018-12-27 DIAGNOSIS — M21621 Bunionette of right foot: Secondary | ICD-10-CM

## 2018-12-27 DIAGNOSIS — M2012 Hallux valgus (acquired), left foot: Secondary | ICD-10-CM

## 2018-12-27 NOTE — Progress Notes (Signed)
She presents today for her first postop visit date of surgery is 12/21/2018 for the right foot Liane Comber bunionectomy with the fifth met osteotomy.  She states that is been doing great no problems.  She also presents today for postop visit date of surgery 12/07/2018 left foot consistent with an Liane Comber bunionectomy and fifth metatarsal osteotomy and excision of a plantar fibroma.  He is doing very well with this and has had no complications.  Objective: Vital signs are stable she is alert and oriented x3 pulses are palpable.  Dressed her dressings once removed demonstrate sutures and margins are intact right and sutures are intact plantar aspect of the left foot portion of those were removed last week and surgery.  The remainder of them were removed today and remained well coapted.  The right foot has good range of motion moderate edema no erythema cellulitis drainage or odor and radiographically appears to be in good position.  Assessment: Well-healing surgical foot left mild edema.  Well-healing surgical foot right 1 week status post Liane Comber and fifth met osteotomy.  Plan: Redressed the right foot today dressed a compressive dressing continue use of Cam walker at all times.  I placed her left foot in a compression anklet and we will allow her to start submerging this foot and getting it wet washing it and cleaning it.  Order follow-up with her in 1 week for evaluation of the right foot.

## 2019-01-01 ENCOUNTER — Other Ambulatory Visit: Payer: Self-pay

## 2019-01-01 ENCOUNTER — Encounter: Payer: Self-pay | Admitting: Podiatry

## 2019-01-01 ENCOUNTER — Ambulatory Visit (INDEPENDENT_AMBULATORY_CARE_PROVIDER_SITE_OTHER): Payer: BC Managed Care – PPO

## 2019-01-01 ENCOUNTER — Ambulatory Visit (INDEPENDENT_AMBULATORY_CARE_PROVIDER_SITE_OTHER): Payer: BC Managed Care – PPO | Admitting: Podiatry

## 2019-01-01 DIAGNOSIS — M2011 Hallux valgus (acquired), right foot: Secondary | ICD-10-CM

## 2019-01-01 DIAGNOSIS — M21622 Bunionette of left foot: Secondary | ICD-10-CM

## 2019-01-01 DIAGNOSIS — Z9889 Other specified postprocedural states: Secondary | ICD-10-CM

## 2019-01-01 DIAGNOSIS — M2012 Hallux valgus (acquired), left foot: Secondary | ICD-10-CM | POA: Diagnosis not present

## 2019-01-01 DIAGNOSIS — M21621 Bunionette of right foot: Secondary | ICD-10-CM

## 2019-01-01 DIAGNOSIS — M722 Plantar fascial fibromatosis: Secondary | ICD-10-CM

## 2019-01-02 NOTE — Progress Notes (Signed)
She presents today for follow-up of her bilateral foot surgery she is status post [place] bunionectomy and fifth metatarsal osteotomies bilateral with the first procedure was performed December 07, 2018 the second December 21, 2018.  She states that she is doing very well she is tolerating the procedures just fine she continues to wear her Darco shoe on her left foot and cam walker on her right foot.  Objective: Vital signs are stable she is alert oriented x3.  Presents today dressed her dressing intact was removed demonstrates moderate edema no erythema cellulitis drainage or odor incision sites are gone on to heal uneventfully suture ends were removed today.  She has great range of motion of the first metatarsophalangeal joints bilaterally.  Excision of plantar fibroma to the plantar aspect of the left foot that incision site is gone on to heal uneventfully as well.  Assessment: Well-healing surgical foot bilaterally.  Plan: Placed her in another Darco shoe today instructed her that she may try to get into a tennis shoe on her left foot.  I will follow-up with her in 2 weeks for another set of x-rays bilaterally

## 2019-01-03 ENCOUNTER — Encounter: Payer: BC Managed Care – PPO | Admitting: Podiatry

## 2019-01-08 ENCOUNTER — Encounter: Payer: BC Managed Care – PPO | Admitting: Podiatry

## 2019-01-15 ENCOUNTER — Ambulatory Visit (INDEPENDENT_AMBULATORY_CARE_PROVIDER_SITE_OTHER): Payer: BC Managed Care – PPO

## 2019-01-15 ENCOUNTER — Encounter: Payer: Self-pay | Admitting: Podiatry

## 2019-01-15 ENCOUNTER — Other Ambulatory Visit: Payer: Self-pay

## 2019-01-15 ENCOUNTER — Ambulatory Visit (INDEPENDENT_AMBULATORY_CARE_PROVIDER_SITE_OTHER): Payer: BC Managed Care – PPO | Admitting: Podiatry

## 2019-01-15 DIAGNOSIS — M21622 Bunionette of left foot: Secondary | ICD-10-CM

## 2019-01-15 DIAGNOSIS — M722 Plantar fascial fibromatosis: Secondary | ICD-10-CM

## 2019-01-15 DIAGNOSIS — M21621 Bunionette of right foot: Secondary | ICD-10-CM

## 2019-01-15 DIAGNOSIS — Z9889 Other specified postprocedural states: Secondary | ICD-10-CM

## 2019-01-15 DIAGNOSIS — M2011 Hallux valgus (acquired), right foot: Secondary | ICD-10-CM | POA: Diagnosis not present

## 2019-01-15 DIAGNOSIS — M2012 Hallux valgus (acquired), left foot: Secondary | ICD-10-CM

## 2019-01-15 NOTE — Progress Notes (Signed)
She presents today for postop visit to the bilateral foot.  Date of surgery for the last foot was December 4 previous foot which was the left foot was November 20.  She states that she is doing really well and is having no problems.  Objective: Vital signs are stable alert and oriented x3 pulses are palpable.  Left foot still demonstrates some mild edema but very minimal.  Incision site is gone on to heal uneventfully there is no open lesions or wounds.  Radiographs of the left foot taken today demonstrate well-healing surgical foot.  Assessment: Well-healing surgical foot bilaterally.  Plan: Continue use of her Darco shoes and I will follow-up with her in about 3 weeks at which time x-rays of bilateral foot will be taken.

## 2019-01-22 ENCOUNTER — Encounter: Payer: Self-pay | Admitting: Podiatry

## 2019-01-29 ENCOUNTER — Ambulatory Visit (INDEPENDENT_AMBULATORY_CARE_PROVIDER_SITE_OTHER): Payer: 59 | Admitting: Podiatry

## 2019-01-29 ENCOUNTER — Encounter: Payer: Self-pay | Admitting: Podiatry

## 2019-01-29 ENCOUNTER — Telehealth: Payer: Self-pay | Admitting: *Deleted

## 2019-01-29 ENCOUNTER — Other Ambulatory Visit: Payer: Self-pay

## 2019-01-29 ENCOUNTER — Ambulatory Visit (INDEPENDENT_AMBULATORY_CARE_PROVIDER_SITE_OTHER): Payer: 59

## 2019-01-29 DIAGNOSIS — M21621 Bunionette of right foot: Secondary | ICD-10-CM

## 2019-01-29 DIAGNOSIS — M2012 Hallux valgus (acquired), left foot: Secondary | ICD-10-CM | POA: Diagnosis not present

## 2019-01-29 DIAGNOSIS — M21622 Bunionette of left foot: Secondary | ICD-10-CM

## 2019-01-29 DIAGNOSIS — M2011 Hallux valgus (acquired), right foot: Secondary | ICD-10-CM | POA: Diagnosis not present

## 2019-01-29 DIAGNOSIS — M722 Plantar fascial fibromatosis: Secondary | ICD-10-CM

## 2019-01-29 DIAGNOSIS — Z9889 Other specified postprocedural states: Secondary | ICD-10-CM

## 2019-01-29 NOTE — Telephone Encounter (Signed)
Delivered required form, and demographics to [facility].

## 2019-01-29 NOTE — Telephone Encounter (Signed)
-----   Message from [place] sent at 01/29/2019  4:42 PM EST ----- Regarding: PT [facility] - in house  DOS 12/4 RIGHT  DOS 11/20 LEFT  Increase ROM, s/p bunion repairs  3 x week x 4 weeks

## 2019-01-30 NOTE — Progress Notes (Signed)
She presents today date of surgeries 12/21/2018 12/07/2018 states that she is doing just great no problems whatsoever still has a little swelling on the left foot but the right foot is doing really well.  The left foot was the first 1 performed with excision of the plantar fibroma as well.  Objective: Vital signs are stable alert and oriented x3 there is no erythema there is mild edema no cellulitis drainage odor she got great range of motion is somewhat tender first metatarsophalangeal joint.  Assessment: Well-healing surgical foot.  Plan: I will to send her to physical therapy to increase range of motion to help decrease the edema follow-up with me in a about 6 weeks after physical therapy as been performed.

## 2019-03-12 ENCOUNTER — Other Ambulatory Visit: Payer: Self-pay

## 2019-03-12 ENCOUNTER — Ambulatory Visit (INDEPENDENT_AMBULATORY_CARE_PROVIDER_SITE_OTHER): Payer: 59 | Admitting: Podiatry

## 2019-03-12 ENCOUNTER — Ambulatory Visit (INDEPENDENT_AMBULATORY_CARE_PROVIDER_SITE_OTHER): Payer: 59

## 2019-03-12 DIAGNOSIS — M21622 Bunionette of left foot: Secondary | ICD-10-CM

## 2019-03-12 DIAGNOSIS — M21621 Bunionette of right foot: Secondary | ICD-10-CM

## 2019-03-12 DIAGNOSIS — M2011 Hallux valgus (acquired), right foot: Secondary | ICD-10-CM

## 2019-03-12 DIAGNOSIS — M2012 Hallux valgus (acquired), left foot: Secondary | ICD-10-CM | POA: Diagnosis not present

## 2019-03-12 DIAGNOSIS — Z9889 Other specified postprocedural states: Secondary | ICD-10-CM

## 2019-03-12 DIAGNOSIS — M722 Plantar fascial fibromatosis: Secondary | ICD-10-CM

## 2019-03-13 NOTE — Progress Notes (Signed)
She presents today for follow-up of her bilateral [place] bunionectomy since fifth metatarsal osteotomies.  She denies fever chills nausea vomiting muscle aches and pain states that she continues physical therapy for stiffness in the big toe of the left.  Objective: Vital signs are stable she is alert and oriented x3 presents today ambulating regular shoe gear with silicone sheeting over top of the scars.  Scars appear to be doing very well she has great range of motion of both the first and fifth metatarsophalangeal joints bilaterally.  Radiographs taken today demonstrate well healing osteotomies internal fixation is intact and has not loosened.  Assessment: Well-healing surgical foot date of surgery to the right foot December 21, 2018.  Date of surgery to the left foot December 07, 2018.  Plan: Continue physical therapy to help loosen the stiffness that she needs states that she has in the first metatarsophalangeal joint left.  Follow-up with me in 1 month for her final postop visit

## 2019-04-02 ENCOUNTER — Other Ambulatory Visit: Payer: Self-pay | Admitting: Neurosurgery

## 2019-04-02 DIAGNOSIS — D432 Neoplasm of uncertain behavior of brain, unspecified: Secondary | ICD-10-CM

## 2019-04-08 ENCOUNTER — Other Ambulatory Visit: Payer: Self-pay

## 2019-04-08 ENCOUNTER — Ambulatory Visit: Payer: 59 | Admitting: Medical

## 2019-04-08 ENCOUNTER — Encounter: Payer: Self-pay | Admitting: Medical

## 2019-04-08 VITALS — BP 120/80 | HR 77 | Temp 98.4°F | Ht 66.0 in | Wt 188.2 lb

## 2019-04-08 DIAGNOSIS — G4700 Insomnia, unspecified: Secondary | ICD-10-CM | POA: Diagnosis not present

## 2019-04-08 DIAGNOSIS — E041 Nontoxic single thyroid nodule: Secondary | ICD-10-CM | POA: Diagnosis not present

## 2019-04-08 DIAGNOSIS — G8929 Other chronic pain: Secondary | ICD-10-CM

## 2019-04-08 DIAGNOSIS — L609 Nail disorder, unspecified: Secondary | ICD-10-CM | POA: Insufficient documentation

## 2019-04-08 DIAGNOSIS — R632 Polyphagia: Secondary | ICD-10-CM

## 2019-04-08 DIAGNOSIS — Z8659 Personal history of other mental and behavioral disorders: Secondary | ICD-10-CM

## 2019-04-08 DIAGNOSIS — R635 Abnormal weight gain: Secondary | ICD-10-CM

## 2019-04-08 NOTE — Progress Notes (Signed)
Subjective: Chief Complaint  Patient presents with  . New Patient (Initial Visit)    establish care   . Thyroid Nodule    right side    Here as a new patient today.  Has been seeing Dr. Chalmers Cater and Dr. Iona Beard Osei-Bonsu prior.  Medical team: Dr. Ashok Pall, [facility] Dr. Chalmers Cater, endocrinology Has been seeing   She notes hx/o brain tumor, not thought to be cancerous and thyroid nodules.     Has concerns about her thyroid.  Has gained 30 lb since 2019.  Was 158 lb October 2019, down to 156lb 2019, then 165lb May 2020.   October 2020 170lb, keeps gaining weight.  Appetite is strong, but doesn't get full.  feels her weight gain is out of proportion to appetite and intake.  Gets discomfort in the neck around the "thyroid nodules."   Not sleeping well.  Has sleep medication but doesnt take this all the time.   Feels hot intermittent.  No headaches.   Feels anxious often.  No bowels changes, no diarrhea, no constipation.   Has noticed some hair thinning and nails feel more brittle.  No prior thyroid medication, no prior thyroid surgery.  Has had biopsy of thyroid twice prior.    Exercises at gym about 4 days per week.  30 min on elliptical, 39min treadmill.   Mood is much better of late.   Works from home, [employer]  Would like to stop the antidepressants.   Doesn't feel she needs them now.   Past Medical History:  Diagnosis Date  . Allergy   . Chronic pain   . History of depression   . Thyroid disease     Current Outpatient Medications on File Prior to Visit  Medication Sig Dispense Refill  . phentermine 37.5 MG capsule Take by mouth daily.    Marland Kitchen REXULTI 1 MG TABS tablet Take 1 mg by mouth daily.    . sulindac (CLINORIL) 200 MG tablet Take 200 mg by mouth 2 (two) times daily.    . valACYclovir (VALTREX) 1000 MG tablet Take 1,000 mg by mouth daily.    Marland Kitchen venlafaxine XR (EFFEXOR-XR) 75 MG 24 hr capsule Take 75 mg by mouth daily.  1  .  cyclobenzaprine (FLEXERIL) 10 MG tablet Take 10 mg by mouth 3 (three) times daily as needed for muscle spasms.   1  . hydrOXYzine (VISTARIL) 25 MG capsule     . ondansetron (ZOFRAN) 4 MG tablet Take 1 tablet (4 mg total) by mouth every 8 (eight) hours as needed. (Patient not taking: Reported on 04/08/2019) 20 tablet 0   No current facility-administered medications on file prior to visit.    ROS as in subjective    Objective BP 120/80   Pulse 77   Temp 98.4 F (36.9 C)   Ht 5\' 6"  (1.676 m)   Wt 188 lb 3.2 oz (85.4 kg)   LMP 10/18/2011   SpO2 95%   BMI 30.38 kg/m   General appearence: alert, no distress, WD/WN, African Amercian female Neck: supple, no lymphadenopathy, no thyromegaly, no masses Heart: RRR, normal S1, S2, no murmurs Lungs: CTA bilaterally, no wheezes, rhonchi, or rales Abdomen: +bs, soft, non tender, non distended, no masses, no hepatomegaly, no splenomegaly Pulses: 2+ symmetric, upper and lower extremities, normal cap refill No ext edema    Assessment: Encounter Diagnoses  Name Primary?  . Weight gain Yes  . Thyroid nodule   . Appetite increase   . Insomnia,  unspecified type   . History of depression   . Other chronic pain   . Nail abnormalities     Plan: We discussed her symptoms and concerns.  Labs today to further evaluate.  If thyroid labs are normal consider decreasing dose of antidepressants to titrate downward as her weight gain could be attributing to medication reaction from antidepressants.  She feels ready to titrate off antidepressant in general.  We discussed gradually and slowly titrating downward from antidepressants instead of stopping immediately.  Discussed differential of her symptoms.  Follow-up pending labs   Katrina Marquez was seen today for new patient (initial visit) and thyroid nodule.  Diagnoses and all orders for this visit:  Weight gain -     Comprehensive metabolic panel -     CBC with Differential/Platelet  Thyroid nodule -      TSH -     T4, free -     Sedimentation rate  Appetite increase -     TSH -     T4, free -     Comprehensive metabolic panel -     CBC with Differential/Platelet -     Sedimentation rate  Insomnia, unspecified type  History of depression  Other chronic pain -     Sedimentation rate  Nail abnormalities -     TSH -     T4, free -     Sedimentation rate

## 2019-04-09 ENCOUNTER — Telehealth: Payer: Self-pay | Admitting: Medical

## 2019-04-09 ENCOUNTER — Other Ambulatory Visit: Payer: Self-pay | Admitting: Medical

## 2019-04-09 LAB — CBC WITH DIFFERENTIAL/PLATELET
Basophils Absolute: 0 10*3/uL (ref 0.0–0.2)
Basos: 1 %
EOS (ABSOLUTE): 0.1 10*3/uL (ref 0.0–0.4)
Eos: 2 %
Hematocrit: 39.8 % (ref 34.0–46.6)
Hemoglobin: 13.4 g/dL (ref 11.1–15.9)
Immature Grans (Abs): 0 10*3/uL (ref 0.0–0.1)
Immature Granulocytes: 0 %
Lymphocytes Absolute: 1.9 10*3/uL (ref 0.7–3.1)
Lymphs: 47 %
MCH: 31 pg (ref 26.6–33.0)
MCHC: 33.7 g/dL (ref 31.5–35.7)
MCV: 92 fL (ref 79–97)
Monocytes Absolute: 0.4 10*3/uL (ref 0.1–0.9)
Monocytes: 10 %
Neutrophils Absolute: 1.6 10*3/uL (ref 1.4–7.0)
Neutrophils: 40 %
Platelets: 277 10*3/uL (ref 150–450)
RBC: 4.32 x10E6/uL (ref 3.77–5.28)
RDW: 12.8 % (ref 11.7–15.4)
WBC: 4.1 10*3/uL (ref 3.4–10.8)

## 2019-04-09 LAB — COMPREHENSIVE METABOLIC PANEL
ALT: 13 IU/L (ref 0–32)
AST: 20 IU/L (ref 0–40)
Albumin/Globulin Ratio: 1.8 (ref 1.2–2.2)
Albumin: 4.4 g/dL (ref 3.8–4.9)
Alkaline Phosphatase: 102 IU/L (ref 39–117)
BUN/Creatinine Ratio: 14 (ref 12–28)
BUN: 15 mg/dL (ref 8–27)
Bilirubin Total: 0.6 mg/dL (ref 0.0–1.2)
CO2: 21 mmol/L (ref 20–29)
Calcium: 9.7 mg/dL (ref 8.7–10.3)
Chloride: 102 mmol/L (ref 96–106)
Creatinine, Ser: 1.07 mg/dL — ABNORMAL HIGH (ref 0.57–1.00)
GFR calc Af Amer: 65 mL/min/{1.73_m2} (ref >59)
GFR calc non Af Amer: 57 mL/min/{1.73_m2} — ABNORMAL LOW (ref >59)
Globulin, Total: 2.4 g/dL (ref 1.5–4.5)
Glucose: 84 mg/dL (ref 65–99)
Potassium: 4.5 mmol/L (ref 3.5–5.2)
Sodium: 138 mmol/L (ref 134–144)
Total Protein: 6.8 g/dL (ref 6.0–8.5)

## 2019-04-09 LAB — SEDIMENTATION RATE: Sed Rate: 19 mm/hr (ref 0–40)

## 2019-04-09 LAB — T4, FREE: Free T4: 0.96 ng/dL (ref 0.82–1.77)

## 2019-04-09 LAB — TSH: TSH: 2.19 u[IU]/mL (ref 0.450–4.500)

## 2019-04-09 MED ORDER — BREXPIPRAZOLE 0.25 MG PO TABS: 0.2500 mg | ORAL_TABLET | Freq: Every day | ORAL | 0 refills | Status: DC

## 2019-04-09 NOTE — Telephone Encounter (Signed)
error 

## 2019-04-23 ENCOUNTER — Ambulatory Visit (INDEPENDENT_AMBULATORY_CARE_PROVIDER_SITE_OTHER): Payer: 59

## 2019-04-23 ENCOUNTER — Encounter: Payer: 59 | Admitting: Podiatry

## 2019-04-23 DIAGNOSIS — M21621 Bunionette of right foot: Secondary | ICD-10-CM

## 2019-04-23 DIAGNOSIS — M2012 Hallux valgus (acquired), left foot: Secondary | ICD-10-CM | POA: Diagnosis not present

## 2019-04-23 DIAGNOSIS — M21622 Bunionette of left foot: Secondary | ICD-10-CM | POA: Diagnosis not present

## 2019-04-23 DIAGNOSIS — M2011 Hallux valgus (acquired), right foot: Secondary | ICD-10-CM

## 2019-05-16 ENCOUNTER — Ambulatory Visit
Admission: RE | Admit: 2019-05-16 | Discharge: 2019-05-16 | Disposition: A | Discharge status: Home / Self Care | Payer: 59 | Source: Ambulatory Visit | Attending: Neurosurgery | Admitting: Neurosurgery

## 2019-05-16 DIAGNOSIS — D432 Neoplasm of uncertain behavior of brain, unspecified: Secondary | ICD-10-CM

## 2019-05-16 MED ORDER — GADOBENATE DIMEGLUMINE 529 MG/ML IV SOLN
17.0000 mL | Freq: Once | INTRAVENOUS | Status: AC | PRN
  Administered 2019-05-16: 17 mL via INTRAVENOUS

## 2019-06-10 NOTE — Progress Notes (Signed)
This encounter was created in error - please disregard.

## 2019-11-29 ENCOUNTER — Other Ambulatory Visit: Payer: Self-pay

## 2019-11-29 ENCOUNTER — Ambulatory Visit (INDEPENDENT_AMBULATORY_CARE_PROVIDER_SITE_OTHER): Payer: No Typology Code available for payment source

## 2019-11-29 ENCOUNTER — Encounter: Payer: Self-pay | Admitting: Podiatry

## 2019-11-29 ENCOUNTER — Ambulatory Visit: Payer: No Typology Code available for payment source | Admitting: Podiatry

## 2019-11-29 DIAGNOSIS — R52 Pain, unspecified: Secondary | ICD-10-CM

## 2019-11-29 DIAGNOSIS — M779 Enthesopathy, unspecified: Secondary | ICD-10-CM

## 2019-11-30 NOTE — Progress Notes (Signed)
Subjective:   Patient ID: Katrina Marquez, female   DOB: 59 y.o.   MRN: [id number]   HPI Patient states she is developed quite a bit of discomfort in her forefoot over the last couple months and does not remember any injury.  States it hurts when she walks on it and not as bad when she is sitting.  Patient had previous surgery by Dr. Milinda Pointer last year which was successful for her   ROS      Objective:  Physical Exam  Neurovascular status found to be intact with quite a bit of inflammation in the third MPJ of the right foot that is painful when pressed with mild discomfort in the fourth MPJ slight the second but third being worse.  Patient is found to have good digital perfusion well oriented x3     Assessment:  Inflammatory capsulitis third MPJ right with slight shortening of the first metatarsal which may be creating some extra stress against the lesser metatarsal bones     Plan:  H&P reviewed condition and x-rays.  At this point we will get a focus on the acute inflammation and I did a block of the right third metatarsal sterile prep and then I aspirated the joint getting out a small amount of clear fluid.  I then injected quarter cc dexamethasone Kenalog and applied padding to reduce pressure on the joint and patient will be seen back in the next several weeks  X-rays indicate that there is well-healed surgical sites first and fifth metatarsal right with patient having a natural short first metatarsal bone

## 2020-01-09 ENCOUNTER — Other Ambulatory Visit: Payer: Self-pay

## 2020-01-09 ENCOUNTER — Ambulatory Visit: Payer: Self-pay | Attending: Critical Care Medicine

## 2020-01-09 DIAGNOSIS — Z23 Encounter for immunization: Secondary | ICD-10-CM

## 2020-01-09 NOTE — Progress Notes (Signed)
   Covid-19 Vaccination Clinic  Name:  RASHEMA SEAWRIGHT    MRN: [id number] DOB: Jan 06, 1961  01/09/2020  Ms. Elias was observed post Covid-19 immunization for 15 minutes without incident. She was provided with Vaccine Information Sheet and instruction to access the V-Safe system.   Ms. Fontenot was instructed to call 911 with any severe reactions post vaccine: Marland Kitchen Difficulty breathing  . Swelling of face and throat  . A fast heartbeat  . A bad rash all over body  . Dizziness and weakness   Immunizations Administered    Name Date Dose VIS Date Route   Pfizer COVID-19 Vaccine 01/09/2020  5:49 PM 0.3 mL 11/06/2019 Intramuscular   Manufacturer: [place]   Lot: [id number]   NDC: [address]-7

## 2020-03-11 DIAGNOSIS — M25511 Pain in right shoulder: Secondary | ICD-10-CM | POA: Insufficient documentation

## 2020-06-01 ENCOUNTER — Ambulatory Visit (INDEPENDENT_AMBULATORY_CARE_PROVIDER_SITE_OTHER): Payer: No Typology Code available for payment source

## 2020-06-01 ENCOUNTER — Ambulatory Visit (INDEPENDENT_AMBULATORY_CARE_PROVIDER_SITE_OTHER): Payer: No Typology Code available for payment source | Admitting: Podiatry

## 2020-06-01 ENCOUNTER — Other Ambulatory Visit: Payer: Self-pay

## 2020-06-01 DIAGNOSIS — M79672 Pain in left foot: Secondary | ICD-10-CM

## 2020-06-01 DIAGNOSIS — M79671 Pain in right foot: Secondary | ICD-10-CM | POA: Diagnosis not present

## 2020-06-11 ENCOUNTER — Ambulatory Visit: Payer: No Typology Code available for payment source | Admitting: Podiatry

## 2020-09-01 ENCOUNTER — Ambulatory Visit: Payer: No Typology Code available for payment source | Admitting: Podiatry

## 2020-09-01 ENCOUNTER — Encounter: Payer: Self-pay | Admitting: Podiatry

## 2020-09-01 ENCOUNTER — Other Ambulatory Visit: Payer: Self-pay

## 2020-09-01 DIAGNOSIS — M778 Other enthesopathies, not elsewhere classified: Secondary | ICD-10-CM

## 2020-09-01 MED ORDER — TRIAMCINOLONE ACETONIDE 40 MG/ML IJ SUSP
20.0000 mg | Freq: Once | INTRAMUSCULAR | Status: AC
  Administered 2020-09-01: 20 mg

## 2020-09-02 NOTE — Progress Notes (Signed)
She presents today for a follow-up states that she feels like she is walking about not saw Dr. Felisa Bonier last visit he drained it.  States that is still sore.  Objective: Vital signs are stable alert and x3 she has pain on palpation forefoot right beneath the second metatarsal phalangeal joint she has pain on end range of motion.  Pulses remain strong and palpable.  Assessment: Capsulitis second metatarsophalangeal joint.  Plan: Injection 10 mg Kenalog 5 mg Marcaine around the joint.  Discussed appropriate shoe gear follow-up with [place] in the near future.

## 2020-09-04 ENCOUNTER — Other Ambulatory Visit: Payer: Self-pay | Admitting: Registered Nurse

## 2020-09-04 DIAGNOSIS — M542 Cervicalgia: Secondary | ICD-10-CM

## 2020-09-04 DIAGNOSIS — E041 Nontoxic single thyroid nodule: Secondary | ICD-10-CM

## 2020-11-12 ENCOUNTER — Other Ambulatory Visit: Payer: Self-pay | Admitting: Orthopedic Surgery

## 2020-11-12 DIAGNOSIS — E041 Nontoxic single thyroid nodule: Secondary | ICD-10-CM

## 2020-11-23 ENCOUNTER — Ambulatory Visit
Admission: RE | Admit: 2020-11-23 | Discharge: 2020-11-23 | Disposition: A | Discharge status: Home / Self Care | Payer: No Typology Code available for payment source | Source: Ambulatory Visit | Attending: Orthopedic Surgery | Admitting: Orthopedic Surgery

## 2020-11-23 DIAGNOSIS — E041 Nontoxic single thyroid nodule: Secondary | ICD-10-CM

## 2020-12-02 ENCOUNTER — Other Ambulatory Visit: Payer: Self-pay

## 2021-02-25 ENCOUNTER — Ambulatory Visit: Payer: No Typology Code available for payment source | Admitting: Podiatry

## 2021-03-12 ENCOUNTER — Ambulatory Visit: Payer: No Typology Code available for payment source | Admitting: Endocrinology

## 2021-03-12 ENCOUNTER — Encounter: Payer: Self-pay | Admitting: Endocrinology

## 2021-03-12 ENCOUNTER — Other Ambulatory Visit: Payer: Self-pay

## 2021-03-12 ENCOUNTER — Ambulatory Visit (INDEPENDENT_AMBULATORY_CARE_PROVIDER_SITE_OTHER): Payer: No Typology Code available for payment source | Admitting: Endocrinology

## 2021-03-12 VITALS — BP 126/80 | HR 94 | Ht 66.0 in | Wt 165.2 lb

## 2021-03-12 DIAGNOSIS — E041 Nontoxic single thyroid nodule: Secondary | ICD-10-CM

## 2021-03-12 NOTE — Progress Notes (Signed)
° °  Subjective:    Patient ID: Katrina Marquez, female    DOB: 1960-09-10, 61 y.o.   MRN: [id number]  HPI Pt is referred by for nodular thyroid.  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  she has no h/o XRT or surgery to the neck.  Main symptoms are neck pain (worst at right ant neck, exac by ROM), tremor, hair loss, fatigue, and insomnia.  No h/o injury to the neck.    Review of Systems Denies weight change, hoarseness, numbness, sob.    Objective:   Physical Exam VITAL SIGNS:  See vs page GENERAL: no distress NECK: exam limited by guarding  [place] (2017): Thyromegaly with 2 right nodules, both of which meet criteria for biopsy.    Bxs (2017): both cat 2.     CT (2020): normal except for the 2 nodules  [place] (2022): Unchanged appearance of previously biopsied benign spongiform nodules in the right thyroid gland. No new discrete nodules.    TSH=1.7    Assessment & Plan:

## 2021-03-12 NOTE — Patient Instructions (Signed)
I would be happy to refer you to an ear-nose throat doctor.  Otherwise, there is nothing else I can do for you.   I wish you good health in the future.

## 2021-03-13 NOTE — Progress Notes (Signed)
Subjective   Patient ID: Katrina Marquez, female    DOB: Apr 10, 1960, 61 y.o.   MRN: [id number]   HPI Pt is referred by Dr Jacalyn Lefevre, for [place].  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  she has no h/o XRT or surgery to the neck.  Main symptoms are neck pain (worst at right ant neck, exac by ROM), tremor, hair loss, fatigue, and insomnia.  No h/o injury to the neck.  Pt is very frustrated by what she perceives as health care providers not taking her neck pain seriously.   Past Medical History:  Diagnosis Date   Allergy    Chronic pain    History of depression    Thyroid disease     Past Surgical History:  Procedure Laterality Date   MYOMECTOMY      Social History   Socioeconomic History   Marital status: Married    Spouse name: Not on file   Number of children: Not on file   Years of education: Not on file   Highest education level: Not on file  Occupational History   Not on file  Tobacco Use   Smoking status: Never   Smokeless tobacco: Never  Substance and Sexual Activity   Alcohol use: Yes    Comment: Occasionally.   Drug use: Not on file   Sexual activity: Not on file  Other Topics Concern   Not on file  Social History Narrative   Not on file   Social Determinants of Health   Financial Resource Strain: Not on file  Food Insecurity: Not on file  Transportation Needs: Not on file  Physical Activity: Not on file  Stress: Not on file  Social Connections: Not on file  Intimate Partner Violence: Not on file    Current Outpatient Medications on File Prior to Visit  Medication Sig Dispense Refill   valACYclovir (VALTREX) 1000 MG tablet Take 1,000 mg by mouth daily.     valACYclovir (VALTREX) 500 MG tablet Take 500 mg by mouth 2 (two) times daily.     venlafaxine XR (EFFEXOR-XR) 75 MG 24 hr capsule Take 75 mg by mouth daily.  1   No current facility-administered medications on file prior to visit.    Allergies  Allergen Reactions   Fish Allergy Anaphylaxis   Tomato  Anaphylaxis   Povidone Iodine     itching Other reaction(s): Unknown   Soap     Other reaction(s): Rash    Family History  Problem Relation Age of Onset   CAD Mother    CAD Father    Thyroid disease Neg Hx     LMP 10/18/2011     Review of Systems Denies weight change, hoarseness, numbness, sob.     Objective:    Objective   Physical Exam VITAL SIGNS:  See vs page GENERAL: no distress NECK: exam limited by guarding   [place] (2017): Thyromegaly with 2 right nodules, both of which meet criteria for biopsy.     Bxs (2017): both cat 2.       CT (2020): normal except for the 2 nodules   [place] (2022): Unchanged appearance of previously biopsied benign spongiform nodules in the right thyroid gland. No new discrete nodules.     TSH=1.7   I have reviewed outside records, and summarized: Pt was noted to have elevated MNG, and referred here.  She was seen by ENT in 2017, who cited extensive neg prior w/u, and said he had nothing further to offer.  Assessment & Plan:   MNG: stable after 5 year f/u.   Neck pain: not related to the above.  We discussed.  I told her that given the stable [place], the pain is outside the scope of my practice.  Still, I offered to ref her to ENT        Patient Instructions by Renato Shin, MD at 03/12/2021 9:30 AM  Author: Renato Shin, MD Author Type: Physician Filed: 03/12/2021 10:02 AM  Note Status: Signed Cosign: Cosign Not Required Encounter Date: 03/12/2021  Editor: Renato Shin, MD (Physician)               I would be happy to refer you to an ear-nose throat doctor.  Otherwise, there is nothing else I can do for you.   I wish you good health in the future.         Instructions  I would be happy to refer you to an ear-nose throat doctor.  Otherwise, there is nothing else I can do for you.   I wish you good health in the future.

## 2021-03-16 ENCOUNTER — Telehealth: Payer: Self-pay | Admitting: Internal Medicine

## 2021-03-16 NOTE — Telephone Encounter (Signed)
Patient met with Renita Papa regarding a transfer of care from dr Loanne Drilling to Dr Kelton Pillar.  Dr Kelton Pillar ok'd transfer of care.  I called patient to schedule and she requested to call us back and schedule when her schedule is more settled.  Is having a procedure done that will take recovery time.

## 2021-03-18 ENCOUNTER — Ambulatory Visit: Payer: No Typology Code available for payment source | Admitting: Podiatry

## 2021-04-07 ENCOUNTER — Other Ambulatory Visit: Payer: Self-pay | Admitting: Neurosurgery

## 2021-04-07 DIAGNOSIS — D432 Neoplasm of uncertain behavior of brain, unspecified: Secondary | ICD-10-CM

## 2021-04-20 DIAGNOSIS — M542 Cervicalgia: Secondary | ICD-10-CM | POA: Insufficient documentation

## 2021-04-20 DIAGNOSIS — M79604 Pain in right leg: Secondary | ICD-10-CM | POA: Insufficient documentation

## 2021-05-05 ENCOUNTER — Other Ambulatory Visit: Payer: Self-pay | Admitting: Dentist

## 2021-05-05 ENCOUNTER — Other Ambulatory Visit: Payer: Self-pay | Admitting: Dentistry

## 2021-05-05 DIAGNOSIS — M279 Disease of jaws, unspecified: Secondary | ICD-10-CM

## 2021-05-26 ENCOUNTER — Ambulatory Visit
Admission: RE | Admit: 2021-05-26 | Discharge: 2021-05-26 | Disposition: A | Discharge status: Home / Self Care | Payer: No Typology Code available for payment source | Source: Ambulatory Visit | Attending: Neurosurgery | Admitting: Neurosurgery

## 2021-05-26 DIAGNOSIS — D432 Neoplasm of uncertain behavior of brain, unspecified: Secondary | ICD-10-CM

## 2021-05-26 MED ORDER — GADOBENATE DIMEGLUMINE 529 MG/ML IV SOLN
15.0000 mL | Freq: Once | INTRAVENOUS | Status: AC | PRN
  Administered 2021-05-26: 15 mL via INTRAVENOUS

## 2021-06-01 ENCOUNTER — Ambulatory Visit: Payer: No Typology Code available for payment source | Admitting: Podiatry

## 2021-06-01 ENCOUNTER — Encounter: Payer: Self-pay | Admitting: Podiatry

## 2021-06-01 DIAGNOSIS — T148XXA Other injury of unspecified body region, initial encounter: Secondary | ICD-10-CM

## 2021-06-01 DIAGNOSIS — M778 Other enthesopathies, not elsewhere classified: Secondary | ICD-10-CM | POA: Diagnosis not present

## 2021-06-01 DIAGNOSIS — M722 Plantar fascial fibromatosis: Secondary | ICD-10-CM

## 2021-06-01 MED ORDER — DEXAMETHASONE SODIUM PHOSPHATE 120 MG/30ML IJ SOLN
2.0000 mg | Freq: Once | INTRAMUSCULAR | Status: AC
  Administered 2021-06-01: 2 mg via INTRA_ARTICULAR

## 2021-06-01 NOTE — Progress Notes (Signed)
She presents today after having not seen her since August of last year with a chief concern of painful second metatarsal phalangeal joint of the right foot.  States that it really never improved even after Dr. Paulla Dolly aspirated it the time before last.  Also she had another injection the last time she was in.  She states that this really has not helped resolve anything she has tried shoes anti-inflammatory nothing seems to help.  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  She states that this is painful and is starting to affect her ability to perform her daily activities even causing her to walk on the lateral aspect of her foot which is causing her back to hurt. ? ?Objective: Vital signs are stable she is alert and oriented x3.  Pulses are palpable.  Plantar fibromas are noted bilaterally even though resected 1 on the left foot has started to redevelop the right foot is growing larger and is tender on palpation.  Is greater than 2 cm in diameter medial longitudinal band of the plantar fascia right foot.  Next is a capsulitis chronic of the second metatarsal phalangeal joint right foot possible chronic capsulitis or even a tear of the plantar plate. ? ?Assessment: Possible plantar plate tear or capsulitis right second metatarsal phalangeal joint plantar fibroma right second metatarsophalangeal joint. ? ?Plan: Requesting MRI for surgical consideration of the second metatarsal phalangeal joint as well as differential diagnosis.  Also requesting MRI of the plantar fibroma which I do think will all be caught in the forefoot image primarily with contrast for evaluation of depth and spread of this lesion.  Margins of parameters will be necessary.  This is necessary for surgical intervention.  I did inject the second metatarsal phalangeal joint today with 2 mg of dexamethasone just to help alleviate her symptoms. ?

## 2021-06-02 ENCOUNTER — Other Ambulatory Visit: Payer: No Typology Code available for payment source

## 2021-06-04 ENCOUNTER — Ambulatory Visit
Admission: RE | Admit: 2021-06-04 | Discharge: 2021-06-04 | Disposition: A | Discharge status: Home / Self Care | Payer: No Typology Code available for payment source | Source: Ambulatory Visit | Attending: Dentistry | Admitting: Dentistry

## 2021-06-04 DIAGNOSIS — M279 Disease of jaws, unspecified: Secondary | ICD-10-CM

## 2021-06-04 MED ORDER — IOPAMIDOL (ISOVUE-300) INJECTION 61%
75.0000 mL | Freq: Once | INTRAVENOUS | Status: AC | PRN
  Administered 2021-06-04: 75 mL via INTRAVENOUS

## 2021-07-17 DIAGNOSIS — R5383 Other fatigue: Secondary | ICD-10-CM | POA: Insufficient documentation

## 2021-07-24 ENCOUNTER — Ambulatory Visit
Admission: RE | Admit: 2021-07-24 | Discharge: 2021-07-24 | Disposition: A | Discharge status: Home / Self Care | Payer: No Typology Code available for payment source | Source: Ambulatory Visit | Attending: Podiatry | Admitting: Podiatry

## 2021-07-24 ENCOUNTER — Encounter: Payer: Self-pay | Admitting: Podiatry

## 2021-07-24 DIAGNOSIS — M722 Plantar fascial fibromatosis: Secondary | ICD-10-CM

## 2021-07-24 DIAGNOSIS — T148XXA Other injury of unspecified body region, initial encounter: Secondary | ICD-10-CM

## 2021-07-24 MED ORDER — GADOBENATE DIMEGLUMINE 529 MG/ML IV SOLN
15.0000 mL | Freq: Once | INTRAVENOUS | Status: AC | PRN
  Administered 2021-07-24: 15 mL via INTRAVENOUS

## 2021-07-30 DIAGNOSIS — R200 Anesthesia of skin: Secondary | ICD-10-CM | POA: Insufficient documentation

## 2021-07-31 DIAGNOSIS — M62838 Other muscle spasm: Secondary | ICD-10-CM | POA: Insufficient documentation

## 2021-08-06 ENCOUNTER — Telehealth: Payer: Self-pay | Admitting: *Deleted

## 2021-08-06 NOTE — Telephone Encounter (Signed)
Patient is calling for MRI results from 07/24/21, please advise.

## 2021-08-09 ENCOUNTER — Encounter: Payer: Self-pay | Admitting: *Deleted

## 2021-08-09 ENCOUNTER — Telehealth: Payer: Self-pay | Admitting: *Deleted

## 2021-08-09 NOTE — Telephone Encounter (Signed)
-----   Message from Garrel Ridgel, [place] sent at 08/03/2021  8:09 AM EDT ----- Send for over read and inform the patient of the delay.  Also ask them to look at the second MTPJ where her pain is. Not just the fibroma

## 2021-08-09 NOTE — Telephone Encounter (Signed)
He wants an overread sent out on her MRI. You can let her know that and we will contact her once we get those results in .

## 2021-08-09 NOTE — Telephone Encounter (Signed)
This encounter was created in error - please disregard.

## 2021-08-09 NOTE — Telephone Encounter (Signed)
Faxed request to [facility] to send patient's MRI disc to [facility].  Faxed order to [facility] for what to review for 2nd opinion.

## 2021-08-09 NOTE — Telephone Encounter (Signed)
Patient notified thru voice message

## 2021-09-01 ENCOUNTER — Telehealth: Payer: Self-pay

## 2021-09-02 NOTE — Telephone Encounter (Signed)
The report is ready, waiting for the doctor to sign off on it, which he will do tomorrow, will fax over the report.

## 2021-09-02 NOTE — Telephone Encounter (Signed)
Patient called to check on the MRI  results she is having a lot of swelling and now it is causing her hip and back pain because she is trying to alter the weight she is putting on that foot.

## 2021-09-02 NOTE — Telephone Encounter (Signed)
No further notes needed

## 2021-09-14 ENCOUNTER — Telehealth: Payer: Self-pay | Admitting: Podiatry

## 2021-09-14 NOTE — Telephone Encounter (Signed)
Patient called and stated she was unable to come in the office due to her being sick but she is requesting the results of her MRI being that she is unable to walk. She is walking on the side of her foot which is now making her hip hurt.  Please advise

## 2021-09-14 NOTE — Telephone Encounter (Signed)
In doctor's folder (copy)to review.

## 2021-09-15 NOTE — Telephone Encounter (Signed)
She needs to wait until she feels better to come in. He needs to discuss it and treatment options to see how she wants to proceed.

## 2021-09-15 NOTE — Telephone Encounter (Signed)
Called patient giving results, verbalized understanding, transferred her for scheduling to discuss treatment options.

## 2021-09-15 NOTE — Telephone Encounter (Signed)
Please advise 

## 2021-09-15 NOTE — Telephone Encounter (Signed)
Ok,will call back to schedule once feeling better.

## 2021-09-16 ENCOUNTER — Ambulatory Visit: Payer: No Typology Code available for payment source | Admitting: Podiatry

## 2021-10-05 ENCOUNTER — Ambulatory Visit (INDEPENDENT_AMBULATORY_CARE_PROVIDER_SITE_OTHER): Payer: No Typology Code available for payment source | Admitting: Podiatry

## 2021-10-05 DIAGNOSIS — M778 Other enthesopathies, not elsewhere classified: Secondary | ICD-10-CM | POA: Diagnosis not present

## 2021-10-05 DIAGNOSIS — G5792 Unspecified mononeuropathy of left lower limb: Secondary | ICD-10-CM

## 2021-10-05 NOTE — Progress Notes (Signed)
She presents today for follow-up of her MRI of her right foot she states the right foot is still swelling around the first metatarsal and second metatarsal phalangeal joint area still painful on the bottom with numbness in the toes.  States that the swelling really never goes down even with her feet elevated.  She is also complaining of pain around the fifth metatarsal head of the left foot where osteotomies were performed in the past.  Objective: Vital signs are stable tellurian x3 still has pain on palpation and end range of motion with mild edema at the level of the second metatarsophalangeal joint of the right foot.  She also has pain on palpation of the nerve crossing over the fifth metatarsal phalangeal joint left foot.  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  Plan: At this point today I injected 2 mg of dexamethasone to the point of maximal tenderness plantar aspect of the second metatarsal phalangeal joint.  She tolerated procedure well without complications.  We did discuss the need for surgical intervention and we will follow-up with her in 2 weeks at which time we will most likely discuss surgery at the fifth metatarsal left foot and a second metatarsal osteotomy.

## 2021-10-13 ENCOUNTER — Telehealth: Payer: Self-pay | Admitting: *Deleted

## 2021-10-13 NOTE — Telephone Encounter (Addendum)
Patient is calling to ask if physician to compare  xrays of her left (2021- no problems after surgery)foot vs  right foot to see if the same problems existed and possibly having surgery on that one again. She is still having pain and stiffness,even more swelling. Please advise.

## 2021-10-18 NOTE — Telephone Encounter (Signed)
Called patient giving information , will surgery be able to correct this?

## 2021-10-20 NOTE — Telephone Encounter (Signed)
Returned the call to patient , no answer, left voice message of physician's recommendations

## 2021-10-25 DIAGNOSIS — R29898 Other symptoms and signs involving the musculoskeletal system: Secondary | ICD-10-CM | POA: Insufficient documentation

## 2021-10-28 ENCOUNTER — Encounter: Payer: Self-pay | Admitting: Podiatry

## 2021-10-28 ENCOUNTER — Ambulatory Visit: Payer: No Typology Code available for payment source | Admitting: Podiatry

## 2021-10-28 DIAGNOSIS — M778 Other enthesopathies, not elsewhere classified: Secondary | ICD-10-CM

## 2021-10-28 DIAGNOSIS — G5792 Unspecified mononeuropathy of left lower limb: Secondary | ICD-10-CM

## 2021-10-28 NOTE — Progress Notes (Signed)
Katrina Marquez presents today for follow-up of her capsulitis right foot.  States that is still really sore under the toes as she refers to the second and third metatarsophalangeal joint as she points to it.  She states that she cannot get comfortable at work she is standing all day and then when she sits she gets spasms.  She states that it really starts toward the end of the day which she feels that may be if she did not have to work as long each day she would do better.  She is looking to have surgery in the near future for this right foot she also has some pain around the fifth metatarsal of her left foot as well.  Objective: I have reviewed her past medical history medications allergies surgery social history.  Pulses remain palpable bilateral.  She has pain on palpation and range of motion of the second and third metatarsal phalangeal joint of the right foot and this is consistent with MRI findings of capsulitis.  No tears are noted.  She also has pain on palpation of the fifth metatarsal phalangeal joint of the left foot with some residual prominence of the lateral condyle.  Assessment: Pain in limb secondary to chronic capsulitis of the second and third metatarsophalangeal joints of the right foot.  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  Plan: Discussed etiology pathology conservative or surgical therapies we discussed removal of painful internal fixation fifth metatarsal of the left foot with a tailor's bunionectomy.  Also the second and third metatarsal osteotomy shortening in nature to the right foot.  We consented her today for these discussed her allergies as well.  Discussed pros and cons of the surgeries and the possible side effects and complications that are associated with that she understands that is minimal to it and will follow-up with me in the near future for surgical intervention.

## 2021-11-02 ENCOUNTER — Telehealth: Payer: Self-pay | Admitting: Podiatry

## 2021-11-02 NOTE — Telephone Encounter (Signed)
DOS: 12/03/2021  [employer]  Procedures: Metatarsal Osteotomy 2nd and 3rd Rt ([id number]), Tailors Bunionectomy Lt [phone]), and Removal Fixation Deep Kwire/Screw 5th Lt ([id number]) DX: M21.541, M20.12, and Z48.89  Individual Deductible: $800 with $0 remaining Family Deductible: $1,600 with $462.63 remaining Individual OOP: $5,000 with $3,018.13 remaining Family OOP: $10,000 with $6,943.40 remaining.  No Prior Authorization is Required Per [employer].  Transaction ID: [id number] Customer ID: [phone]

## 2021-11-15 ENCOUNTER — Other Ambulatory Visit: Payer: Self-pay | Admitting: Internal Medicine

## 2021-11-15 DIAGNOSIS — M542 Cervicalgia: Secondary | ICD-10-CM

## 2021-12-01 ENCOUNTER — Other Ambulatory Visit: Payer: Self-pay | Admitting: Podiatry

## 2021-12-01 MED ORDER — OXYCODONE-ACETAMINOPHEN 10-325 MG PO TABS
1.0000 | ORAL_TABLET | Freq: Three times a day (TID) | ORAL | 0 refills | Status: AC | PRN
Start: 2021-12-01 — End: 2021-12-08

## 2021-12-01 MED ORDER — ONDANSETRON HCL 4 MG PO TABS: 4.0000 mg | ORAL_TABLET | Freq: Three times a day (TID) | ORAL | 0 refills | Status: DC | PRN

## 2021-12-01 MED ORDER — CEPHALEXIN 500 MG PO CAPS: 500.0000 mg | ORAL_CAPSULE | Freq: Three times a day (TID) | ORAL | 0 refills | Status: DC

## 2021-12-03 DIAGNOSIS — M21541 Acquired clubfoot, right foot: Secondary | ICD-10-CM | POA: Diagnosis not present

## 2021-12-03 DIAGNOSIS — Z4889 Encounter for other specified surgical aftercare: Secondary | ICD-10-CM | POA: Diagnosis not present

## 2021-12-03 DIAGNOSIS — M2012 Hallux valgus (acquired), left foot: Secondary | ICD-10-CM | POA: Diagnosis not present

## 2021-12-07 ENCOUNTER — Telehealth: Payer: Self-pay | Admitting: *Deleted

## 2021-12-07 NOTE — Telephone Encounter (Signed)
Patient is calling to ask if it is ok to take her boot off  for a little while in bed, is starting to hurt her heel.  Please advise.

## 2021-12-14 ENCOUNTER — Encounter: Payer: Self-pay | Admitting: Podiatry

## 2021-12-14 ENCOUNTER — Ambulatory Visit (INDEPENDENT_AMBULATORY_CARE_PROVIDER_SITE_OTHER): Payer: No Typology Code available for payment source

## 2021-12-14 ENCOUNTER — Ambulatory Visit (INDEPENDENT_AMBULATORY_CARE_PROVIDER_SITE_OTHER): Payer: No Typology Code available for payment source | Admitting: Podiatry

## 2021-12-14 DIAGNOSIS — M778 Other enthesopathies, not elsewhere classified: Secondary | ICD-10-CM | POA: Diagnosis not present

## 2021-12-14 DIAGNOSIS — M21961 Unspecified acquired deformity of right lower leg: Secondary | ICD-10-CM

## 2021-12-14 DIAGNOSIS — T8460XA Infection and inflammatory reaction due to internal fixation device of unspecified site, initial encounter: Secondary | ICD-10-CM

## 2021-12-14 DIAGNOSIS — Z9889 Other specified postprocedural states: Secondary | ICD-10-CM

## 2021-12-14 NOTE — Progress Notes (Signed)
Katrina Marquez presents today date of surgery 12/03/2021 metatarsal osteotomy shortening metatarsal #2 #3 of the right foot and removal screw fifth metatarsal with tailor's bunionectomy left foot.  She states is really feeling good have not had any pain at all.  She denies fever chills nausea vomit muscle aches pains calf pain back pain chest pain shortness of breath.  Objective: Presents today CAM Walker right Darco shoe left with her husband dressed her dressing intact was removed demonstrates no erythema edema salines drainage odor sutures intact margins.  Radiographs taken today of the right foot demonstrates metatarsal osteotomies internal fixation is in good position.  Assessment: Well-healing surgical foot bilateral.  Plan: Redressed today dressed a compressive dressing follow-up with her in 1 to 2 weeks.

## 2021-12-21 ENCOUNTER — Ambulatory Visit (INDEPENDENT_AMBULATORY_CARE_PROVIDER_SITE_OTHER): Payer: No Typology Code available for payment source | Admitting: Podiatry

## 2021-12-21 DIAGNOSIS — M21961 Unspecified acquired deformity of right lower leg: Secondary | ICD-10-CM

## 2021-12-21 DIAGNOSIS — Z9889 Other specified postprocedural states: Secondary | ICD-10-CM

## 2021-12-21 DIAGNOSIS — T8460XA Infection and inflammatory reaction due to internal fixation device of unspecified site, initial encounter: Secondary | ICD-10-CM

## 2021-12-21 NOTE — Progress Notes (Signed)
She presents today for follow-up of her surgery.  She is about 3 weeks now out on a tailor's bunionectomy left foot with screw removal and she is a second and third metatarsal osteotomy right foot.  She denies fever chills nausea vomit muscle aches pains states that really not wearing a little black shoe very much I really do not like it.  Objective: Vital signs stable alert oriented x 3 I presents today with her soft boots on in her cam boot to her right foot.  Sutures are intact margins well coapted there is minimal edema no erythema cellulitis drainage or odor sutures were removed today margins remain well coapted.  Assessment: Well-healing surgical foot bilateral.  Plan: I will place her in compression anklets today and allow her to get back into regular shoe on the left foot but the right foot were still going to have to do a Darco shoe or her cam boot.  She understands this is amenable to follow-up with her in 2 weeks for another set of x-rays.

## 2022-01-04 ENCOUNTER — Ambulatory Visit (INDEPENDENT_AMBULATORY_CARE_PROVIDER_SITE_OTHER): Payer: No Typology Code available for payment source

## 2022-01-04 ENCOUNTER — Encounter: Payer: Self-pay | Admitting: Podiatry

## 2022-01-04 ENCOUNTER — Ambulatory Visit (INDEPENDENT_AMBULATORY_CARE_PROVIDER_SITE_OTHER): Payer: No Typology Code available for payment source | Admitting: Podiatry

## 2022-01-04 VITALS — BP 128/79 | HR 93

## 2022-01-04 DIAGNOSIS — M21961 Unspecified acquired deformity of right lower leg: Secondary | ICD-10-CM | POA: Diagnosis not present

## 2022-01-04 DIAGNOSIS — Z9889 Other specified postprocedural states: Secondary | ICD-10-CM

## 2022-01-04 DIAGNOSIS — T8460XA Infection and inflammatory reaction due to internal fixation device of unspecified site, initial encounter: Secondary | ICD-10-CM

## 2022-01-04 NOTE — Progress Notes (Signed)
She presents today for follow-up of her second and third metatarsal osteotomy date of surgery was 12/03/2021 we also performed a tailor's bunionectomy with removal screws fifth metatarsal.  She tolerated all of this very well and appears to be healing very well though she does present with a single Loftstrand crutch to the right side.  She denies fever chills nausea vomit muscle aches and pains.  States that seems to be getting better all the time.  Objective: Bilateral foot demonstrates well-healing surgical sites.  More edematous on the right due to the osteotomies.  Radiographs taken today demonstrate internal fixation is good to the right foot the metatarsal osteotomies are healing very well and in good position.  Assessment: Well-healing surgical foot.  Plan: Allow her to get back into regular shoes in about 2 weeks time.  I would like to follow-up with her in 1 month to make sure she is doing well radiographs will be taken at that time.  Questions or concerns she will notify us immediately.

## 2022-01-05 ENCOUNTER — Encounter: Payer: Self-pay | Admitting: Podiatry

## 2022-01-18 ENCOUNTER — Encounter: Payer: No Typology Code available for payment source | Admitting: Podiatry

## 2022-01-20 ENCOUNTER — Encounter: Payer: No Typology Code available for payment source | Admitting: Podiatry

## 2022-01-26 ENCOUNTER — Telehealth: Payer: Self-pay | Admitting: *Deleted

## 2022-01-26 NOTE — Telephone Encounter (Signed)
Patient has an scheduled appointment 02/03/22,sooner appointment ?

## 2022-01-26 NOTE — Telephone Encounter (Signed)
Patient is calling because the bottom of her right foot is painful, sore, please advise

## 2022-02-03 ENCOUNTER — Encounter: Payer: Self-pay | Admitting: Podiatry

## 2022-02-03 ENCOUNTER — Ambulatory Visit (INDEPENDENT_AMBULATORY_CARE_PROVIDER_SITE_OTHER): Payer: No Typology Code available for payment source

## 2022-02-03 ENCOUNTER — Ambulatory Visit (INDEPENDENT_AMBULATORY_CARE_PROVIDER_SITE_OTHER): Payer: No Typology Code available for payment source | Admitting: Podiatry

## 2022-02-03 VITALS — BP 142/81 | HR 90

## 2022-02-03 DIAGNOSIS — M21961 Unspecified acquired deformity of right lower leg: Secondary | ICD-10-CM | POA: Diagnosis not present

## 2022-02-03 DIAGNOSIS — T8460XA Infection and inflammatory reaction due to internal fixation device of unspecified site, initial encounter: Secondary | ICD-10-CM

## 2022-02-03 DIAGNOSIS — Z9889 Other specified postprocedural states: Secondary | ICD-10-CM

## 2022-02-03 DIAGNOSIS — R234 Changes in skin texture: Secondary | ICD-10-CM

## 2022-02-03 NOTE — Progress Notes (Signed)
She presents today for follow-up of her surgery forefoot right.  She states that while she was in the swimming pool for therapy the other day she stepped on the ladder and felt a pop on the bottom of her foot she looked at the bottom of her foot and noticed that she had some skin breakdown.  Objective: Vital signs are stable she is alert and oriented x 3 pulses are palpable.  Foot appears to be healing very nicely.  She still has some swelling and some scar tissue around the second and third metatarsophalangeal joints.  Plantar aspect of the foot does demonstrate some skin back breakdown most likely secondary to cortisone injection for the fibroma which is now gone.  Assessment: Well-healing surgical foot with skin abrasion secondary to the steroid.  Plan: I instructed her to dress this daily with a small amount of Vaseline or Neosporin and that this should go on to heal uneventfully.  I will follow-up with her in about another month.

## 2022-03-08 DIAGNOSIS — K219 Gastro-esophageal reflux disease without esophagitis: Secondary | ICD-10-CM | POA: Insufficient documentation

## 2022-03-08 DIAGNOSIS — M26609 Unspecified temporomandibular joint disorder, unspecified side: Secondary | ICD-10-CM | POA: Insufficient documentation

## 2022-03-08 DIAGNOSIS — M7912 Myalgia of auxiliary muscles, head and neck: Secondary | ICD-10-CM | POA: Insufficient documentation

## 2022-05-09 ENCOUNTER — Encounter: Payer: Self-pay | Admitting: Podiatry

## 2022-05-18 ENCOUNTER — Ambulatory Visit: Payer: No Typology Code available for payment source | Admitting: Podiatry

## 2022-05-18 ENCOUNTER — Telehealth: Payer: Self-pay | Admitting: *Deleted

## 2022-05-18 NOTE — Telephone Encounter (Signed)
Called patient and per physician she does not need to come for her visit , has looked at pictures and the discolored area is coming from injection received at last visit, and should go away within a year,transferred to [place] for payment she has made for today's appointment.

## 2022-07-02 LAB — AMB RESULTS CONSOLE CBG: Glucose: 97

## 2022-07-11 ENCOUNTER — Telehealth: Payer: Self-pay | Admitting: *Deleted

## 2022-07-11 NOTE — Telephone Encounter (Signed)
Patient is  having problems with her right 2 toes still feeling numb, leg feels heavy w/ spasms and problems with low blood pressure(98/88-suggested she contact pcp to be evaluated ). Is still having swelling underneath her surgery foot and  letter that she received for her job, was denied by employer for shorter hours. She would like a more detailed letter sent to job.please advise.

## 2022-07-14 ENCOUNTER — Ambulatory Visit: Payer: No Typology Code available for payment source | Admitting: Dermatology

## 2022-07-14 ENCOUNTER — Encounter: Payer: Self-pay | Admitting: Dermatology

## 2022-07-14 VITALS — BP 84/56 | HR 96

## 2022-07-14 DIAGNOSIS — K625 Hemorrhage of anus and rectum: Secondary | ICD-10-CM | POA: Insufficient documentation

## 2022-07-14 DIAGNOSIS — L819 Disorder of pigmentation, unspecified: Secondary | ICD-10-CM

## 2022-07-14 DIAGNOSIS — Z860100 Personal history of colon polyps, unspecified: Secondary | ICD-10-CM | POA: Insufficient documentation

## 2022-07-14 DIAGNOSIS — Z1283 Encounter for screening for malignant neoplasm of skin: Secondary | ICD-10-CM | POA: Diagnosis not present

## 2022-07-14 DIAGNOSIS — D229 Melanocytic nevi, unspecified: Secondary | ICD-10-CM | POA: Diagnosis not present

## 2022-07-14 DIAGNOSIS — Z1211 Encounter for screening for malignant neoplasm of colon: Secondary | ICD-10-CM | POA: Insufficient documentation

## 2022-07-14 DIAGNOSIS — M722 Plantar fascial fibromatosis: Secondary | ICD-10-CM | POA: Insufficient documentation

## 2022-07-14 DIAGNOSIS — H579 Unspecified disorder of eye and adnexa: Secondary | ICD-10-CM | POA: Insufficient documentation

## 2022-07-14 DIAGNOSIS — L814 Other melanin hyperpigmentation: Secondary | ICD-10-CM | POA: Diagnosis not present

## 2022-07-14 DIAGNOSIS — D259 Leiomyoma of uterus, unspecified: Secondary | ICD-10-CM | POA: Insufficient documentation

## 2022-07-14 DIAGNOSIS — K130 Diseases of lips: Secondary | ICD-10-CM | POA: Diagnosis not present

## 2022-07-14 DIAGNOSIS — B009 Herpesviral infection, unspecified: Secondary | ICD-10-CM | POA: Insufficient documentation

## 2022-07-14 DIAGNOSIS — K5900 Constipation, unspecified: Secondary | ICD-10-CM | POA: Insufficient documentation

## 2022-07-14 DIAGNOSIS — M2740 Unspecified cyst of jaw: Secondary | ICD-10-CM | POA: Insufficient documentation

## 2022-07-14 DIAGNOSIS — H9319 Tinnitus, unspecified ear: Secondary | ICD-10-CM | POA: Insufficient documentation

## 2022-07-14 NOTE — Progress Notes (Signed)
   New Patient Visit   Subjective  Katrina Marquez is a 62 y.o. female who presents for the following: FBSE  Patient states she is here today to est care and for TBSE. Patient denies Hx of bx. Patient denies family history of skin cancer(s).  She reports she has had minimal sun exposure during her lifetime.  The following portions of the chart were reviewed this encounter and updated as appropriate: medications, allergies, medical history  Review of Systems:  No other skin or systemic complaints except as noted in HPI or Assessment and Plan.  Objective  Well appearing patient in no apparent distress; mood and affect are within normal limits.  A full examination was performed including scalp, head, eyes, ears, nose, lips, neck, chest, axillae, abdomen, back, buttocks, bilateral upper extremities, bilateral lower extremities, hands, feet, fingers, toes, fingernails, and toenails. All findings within normal limits unless otherwise noted below.   Relevant exam findings are noted in the Assessment and Plan.    Assessment & Plan   MELANOCYTIC NEVI - Tan-brown and/or pink-flesh-colored symmetric macules and papules - Benign appearing on exam today - Observation - Call clinic for new or changing moles - Recommend daily use of broad spectrum spf 30+ sunscreen to sun-exposed areas. Recommended [employer]  LENTIGINES- Benign normal skin lesions - Benign-appearing - Call for any changes  Angular Cheilitis w/ hyperpigmentation  Exam: hyperpigmentation involving bilateral corners of mouth    Treatment Plan: -Apply aquaphor - Recommended using [place] Posay Melab3 2 times daily   SKIN CANCER SCREENING PERFORMED TODAY   No follow-ups on file.  Documentation: I have reviewed the above documentation for accuracy and completeness, and I agree with the above.  Stasia Cavalier, am acting as scribe for Langston Reusing, DO.  Langston Reusing, DO

## 2022-07-14 NOTE — Patient Instructions (Addendum)
Thank you for visiting my office today. I appreciate your commitment to maintaining and improving your skin health. Here is a summary of the key points from our consultation:  - Skin Examination: We conducted a thorough head-to-toe skin check to establish a baseline. No suspicious findings were noted. 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   - Hypopigmentation on your feet due to previous steroid injections, expected to resolve within 8-12 months.   - A non-cancerous knuckle pad from repetitive trauma.   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 - Skin Care Recommendations:   - Continue to stay well-hydrated and apply lotion after showering.   - Sun Protection: It is crucial to wear sunscreen and reapply every two hours, especially when outdoors or at the beach. Use a powder brush sunscreen for easy reapplication over makeup. We provided information on suitable products from [employer].   - Apply sunscreen on often overlooked areas like your neck and chest.  -For the darkness around your mouth apply the MelaB3 serum morning and evening  - Educational Materials and Samples: We included a goodie bag with various sunscreens for you to try, accommodating different weather conditions and times of the year.  Please continue to monitor your skin and report any new or changing lesions. Looking forward to seeing you at your next appointment to continue our care for your skin health.  Due to recent changes in healthcare laws, you may see results of your pathology and/or laboratory studies on MyChart before the doctors have had a chance to review them. We understand that in some cases there may be results that are confusing or concerning to you. Please understand that not all results are received at the same time and often the doctors may need to interpret multiple results in  order to provide you with the best plan of care or course of treatment. Therefore, we ask that you please give [place] 2 business days to thoroughly review all your results before contacting the office for clarification. Should we see a critical lab result, you will be contacted sooner.   If You Need Anything After Your Visit  If you have any questions or concerns for your doctor, please call our main line at [phone] If no one answers, please leave a voicemail as directed and we will return your call as soon as possible. Messages left after 4 pm will be answered the following business day.   You may also send [place] a message via MyChart. We typically respond to MyChart messages within 1-2 business days.  For prescription refills, please ask your pharmacy to contact our office. Our fax number is [phone].  If you have an urgent issue when the clinic is closed that cannot wait until the next business day, you can page your doctor at the number below.    Please note that while we do our best to be available for urgent issues outside of office hours, we are not available 24/7.   If you have an urgent issue and are unable to reach [place], you may choose to seek medical care at your doctor's office, retail clinic, urgent care center, or emergency room.  If you have a medical emergency, please immediately call 911 or go to the emergency department. In the event of inclement weather, please call our main line at [phone] for an update on the status of any  delays or closures.  Dermatology Medication Tips: Please keep the boxes that topical medications come in in order to help keep track of the instructions about where and how to use these. Pharmacies typically print the medication instructions only on the boxes and not directly on the medication tubes.   If your medication is too expensive, please contact our office at [phone] or send [place] a message through MyChart.   We are unable to tell what  your co-pay for medications will be in advance as this is different depending on your insurance coverage. However, we may be able to find a substitute medication at lower cost or fill out paperwork to get insurance to cover a needed medication.   If a prior authorization is required to get your medication covered by your insurance company, please allow [place] 1-2 business days to complete this process.  Drug prices often vary depending on where the prescription is filled and some pharmacies may offer cheaper prices.  The website www.goodrx.com contains coupons for medications through different pharmacies. The prices here do not account for what the cost may be with help from insurance (it may be cheaper with your insurance), but the website can give you the price if you did not use any insurance.  - You can print the associated coupon and take it with your prescription to the pharmacy.  - You may also stop by our office during regular business hours and pick up a GoodRx coupon card.  - If you need your prescription sent electronically to a different pharmacy, notify our office through [facility] or by phone at [phone]

## 2022-07-18 NOTE — Telephone Encounter (Signed)
Spoke with patient and explaining recommendations per physician to scheduled a f/u visit ,verbalized understanding and said that she would.

## 2022-08-01 NOTE — Progress Notes (Signed)
Pt attended screening event on 07/02/22, where screening results were BP 98/70 and Glucose wnl. At the event, Pt confirmed Dr. Greggory Stallion Osei-Bonsu as her PCP and no SODH insecurties was indicated. Per chart review, Pt was seen by PCP on 11/09/21 and has ongoing podiatry visit. No additional health equity team support indicated at this time.

## 2022-09-08 ENCOUNTER — Telehealth: Payer: No Typology Code available for payment source | Admitting: Nurse Practitioner

## 2022-09-08 DIAGNOSIS — J029 Acute pharyngitis, unspecified: Secondary | ICD-10-CM | POA: Diagnosis not present

## 2022-09-08 DIAGNOSIS — R051 Acute cough: Secondary | ICD-10-CM

## 2022-09-08 DIAGNOSIS — J0110 Acute frontal sinusitis, unspecified: Secondary | ICD-10-CM | POA: Diagnosis not present

## 2022-09-08 MED ORDER — FLUTICASONE PROPIONATE 50 MCG/ACT NA SUSP: 2.0000 | Freq: Every day | NASAL | 6 refills | Status: DC

## 2022-09-08 MED ORDER — AZITHROMYCIN 250 MG PO TABS: ORAL_TABLET | ORAL | 0 refills | Status: AC

## 2022-09-08 MED ORDER — BENZONATATE 100 MG PO CAPS: 100.0000 mg | ORAL_CAPSULE | Freq: Three times a day (TID) | ORAL | 0 refills | Status: DC | PRN

## 2022-09-08 NOTE — Progress Notes (Signed)
E-Visit for Cough  We are sorry that you are not feeling well.  Here is how we plan to help!  Based on your presentation I believe you most likely have A cough due to bacteria.  When patients have a fever and a productive cough with a change in color or increased sputum production, we are concerned about bacterial bronchitis.  If left untreated it can progress to pneumonia.  If your symptoms do not improve with your treatment plan it is important that you contact your provider.   I have prescribed Azithromyin 250 mg: two tablets now and then one tablet daily for 4 additonal days    In addition you may use A prescription cough medication called Tessalon Perles 100mg . You may take 1-2 capsules every 8 hours as needed for your cough.  For the sore throat we recommend warm salt water gargles and sore throat spray or lozenges for relief.   We will also prescribe a nasal spray to help with congestion, post nasal drainage and your sore throat   Meds ordered this encounter  Medications   benzonatate (TESSALON) 100 MG capsule    Sig: Take 1 capsule (100 mg total) by mouth 3 (three) times daily as needed.    Dispense:  30 capsule    Refill:  0   azithromycin (ZITHROMAX) 250 MG tablet    Sig: Take 2 tablets on day 1, then 1 tablet daily on days 2 through 5    Dispense:  6 tablet    Refill:  0   fluticasone (FLONASE) 50 MCG/ACT nasal spray    Sig: Place 2 sprays into both nostrils daily.    Dispense:  16 g    Refill:  6     From your responses in the eVisit questionnaire you describe inflammation in the upper respiratory tract which is causing a significant cough.  This is commonly called Bronchitis and has four common causes:   Allergies Viral Infections Acid Reflux Bacterial Infection Allergies, viruses and acid reflux are treated by controlling symptoms or eliminating the cause. An example might be a cough caused by taking certain blood pressure medications. You stop the cough by changing the  medication. Another example might be a cough caused by acid reflux. Controlling the reflux helps control the cough.  USE OF BRONCHODILATOR ("RESCUE") INHALERS: There is a risk from using your bronchodilator too frequently.  The risk is that over-reliance on a medication which only relaxes the muscles surrounding the breathing tubes can reduce the effectiveness of medications prescribed to reduce swelling and congestion of the tubes themselves.  Although you feel brief relief from the bronchodilator inhaler, your asthma may actually be worsening with the tubes becoming more swollen and filled with mucus.  This can delay other crucial treatments, such as oral steroid medications. If you need to use a bronchodilator inhaler daily, several times per day, you should discuss this with your provider.  There are probably better treatments that could be used to keep your asthma under control.     HOME CARE Only take medications as instructed by your medical team. Complete the entire course of an antibiotic. Drink plenty of fluids and get plenty of rest. Avoid close contacts especially the very young and the elderly Cover your mouth if you cough or cough into your sleeve. Always remember to wash your hands A steam or ultrasonic humidifier can help congestion.   GET HELP RIGHT AWAY IF: You develop worsening fever. You become short of breath You  cough up blood. Your symptoms persist after you have completed your treatment plan MAKE SURE YOU  Understand these instructions. Will watch your condition. Will get help right away if you are not doing well or get worse.    Thank you for choosing an e-visit.  Your e-visit answers were reviewed by a board certified advanced clinical practitioner to complete your personal care plan. Depending upon the condition, your plan could have included both over the counter or prescription medications.  Please review your pharmacy choice. Make sure the pharmacy is open  so you can pick up prescription now. If there is a problem, you may contact your provider through [employer] and have the prescription routed to another pharmacy.  Your safety is important to [place]. If you have drug allergies check your prescription carefully.   For the next 24 hours you can use MyChart to ask questions about today's visit, request a non-urgent call back, or ask for a work or school excuse. You will get an email in the next two days asking about your experience. I hope that your e-visit has been valuable and will speed your recovery.   I spent approximately 5 minutes reviewing the patient's history, current symptoms and coordinating their care today.

## 2022-10-04 NOTE — Progress Notes (Signed)
cancelled

## 2022-11-16 ENCOUNTER — Telehealth: Payer: Self-pay | Admitting: Podiatry

## 2022-11-16 DIAGNOSIS — M7989 Other specified soft tissue disorders: Secondary | ICD-10-CM

## 2022-11-16 DIAGNOSIS — M79661 Pain in right lower leg: Secondary | ICD-10-CM

## 2022-11-16 NOTE — Telephone Encounter (Signed)
Pt called and is having swelling in her right leg and numbness in toes since having surgery and was asking if we did virtual visits as she does not really have time to take off of work. Please advise( I did offer pt to see Dr Carlota Raspberry on 11/4 at 415pm) but she is not able to get off of work.

## 2022-11-16 NOTE — Telephone Encounter (Signed)
Honestly with what she is complaining about it would be better to see her in person to evaluate and get eyes on.

## 2022-11-18 ENCOUNTER — Telehealth: Payer: Self-pay | Admitting: Podiatry

## 2022-11-18 NOTE — Telephone Encounter (Signed)
[employer] and went through Water quality scientist.  No prior auth needed for CPT 93971 for leg ultrasound/DVT.  Ref# for call is [id number]  Para March at [facility] notified for scheduling.

## 2022-11-18 NOTE — Telephone Encounter (Signed)
Addendum:  need to edit to say she said her LEFT foot and leg were fine.  Her right leg is the symptomatic one.

## 2022-11-18 NOTE — Telephone Encounter (Signed)
Called patient back since she was requesting a telemedicine-type of visit.  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  She is having swelling and pain in the right leg/calf/foot with calf pain noted x 3 weeks.  She had surgery on that forefoot in 2023 and feels like the toes have been swollen ever since, but now it's going up her leg.  (Also notes right foot/leg are fine)  Patient would like a new work note, stating she can only work a max of 7.5 hours per day, as she had a note from [place] like this in the past.  She feels it made a huge difference in the achiness in her legs.  Informed her Marylene Land, who initially did this paperwork for her, is no longer with [place], so will forward to [place] to see if she can assist with this work note.  3 months sounds reasonable time frame until she gets this sorted out.  Will set her up for an ultrasound of right leg in near future to r/o DVT.  In the meantime, advised patient to elevate leg, use moist warm compresses on calf.  She currently does not wear compression stockings, but until we find out if there is a DVT, will have her avoid wearing any until after the study has been performed so that nothing possibly becomes dislodged while trying to get the stockings on each day.  But, with her standing for prolonged periods of time while working, she should eventually be wearing compression garments.  F/U with Dr. Al Corpus after the ultrasound.

## 2022-11-22 ENCOUNTER — Ambulatory Visit (HOSPITAL_BASED_OUTPATIENT_CLINIC_OR_DEPARTMENT_OTHER): Payer: No Typology Code available for payment source

## 2022-11-22 ENCOUNTER — Telehealth: Payer: Self-pay

## 2022-11-22 DIAGNOSIS — M79661 Pain in right lower leg: Secondary | ICD-10-CM

## 2022-11-22 DIAGNOSIS — M7989 Other specified soft tissue disorders: Secondary | ICD-10-CM

## 2022-11-22 NOTE — Telephone Encounter (Signed)
Received a message from [facility]  [facility] is NEGATIVE for DVT right leg, and NEGATIVE for Baker's cyst right leg

## 2022-12-01 ENCOUNTER — Encounter: Payer: Self-pay | Admitting: Podiatry

## 2023-01-26 ENCOUNTER — Encounter: Payer: Self-pay | Admitting: Podiatry

## 2023-01-26 ENCOUNTER — Ambulatory Visit (INDEPENDENT_AMBULATORY_CARE_PROVIDER_SITE_OTHER): Payer: No Typology Code available for payment source

## 2023-01-26 ENCOUNTER — Ambulatory Visit (INDEPENDENT_AMBULATORY_CARE_PROVIDER_SITE_OTHER): Payer: No Typology Code available for payment source | Admitting: Podiatry

## 2023-01-26 DIAGNOSIS — M7741 Metatarsalgia, right foot: Secondary | ICD-10-CM | POA: Diagnosis not present

## 2023-01-26 DIAGNOSIS — M778 Other enthesopathies, not elsewhere classified: Secondary | ICD-10-CM | POA: Diagnosis not present

## 2023-01-26 DIAGNOSIS — G5791 Unspecified mononeuropathy of right lower limb: Secondary | ICD-10-CM

## 2023-01-26 NOTE — Progress Notes (Signed)
 Chief Complaint  Patient presents with   Foot Pain    Patient states whatever they did during surgery it messed up her foot and she having difficulty walking , patient states she has no feeling in her 4 toes on right foot . Patient says  she is having muscle spasms . Patient believes she is not getting blood flow to her right foot because her 4th toes have been numb    HPI: 63 y.o. female presents today with complaint of right foot pain and swelling.  She has history of right foot surgery with Dr. Verta, previously underwent right foot bunionectomy, tailor's bunionectomy, Weil osteotomy of second and third metatarsal.  She reports numbness to the lesser toes, burning and tingling sensation in the first interspace and persistent swelling to the forefoot.  Denies any injury or trauma.  Past Medical History:  Diagnosis Date   Allergy    Chronic pain    History of depression    Thyroid  disease     Past Surgical History:  Procedure Laterality Date   MYOMECTOMY      Allergies  Allergen Reactions   Fish Allergy Anaphylaxis   Tomato Anaphylaxis   Povidone Iodine     itching Other reaction(s): Unknown   Soap     Other reaction(s): Rash  [employer] Soaps   Latex Rash   Other Rash    ROS    Physical Exam: There were no vitals filed for this visit.  General: The patient is alert and oriented x3 in no acute distress.  Dermatology: Skin is warm, dry and supple bilateral lower extremities. Interspaces are clear of maceration and debris.  Mildly hypertrophic surgical scars noted  Vascular: Palpable pedal pulses bilaterally. Capillary refill within normal limits.  Mild edema right forefoot versus contralateral side.  No erythema or calor.  Neurological: Light touch sensation diminished to the second, third, fourth toes right foot.  Negative Mulder's click. Decreased protective sensation to the digits. Some symptoms appear to be elicited with dorsal to plantar  compression of the first interspace.  Musculoskeletal Exam: Muscle strength 5/5 in dorsiflexion, plantarflexion, inversion, eversion.  Pain on palpation of the right foot first, second, third, fourth toes and MPJs.  Mild hammertoe contractures.  Radiographic Exam: Right foot 01/26/2023 Normal osseous mineralization. Joint spaces preserved.  No fractures or osseous irregularities noted.  Retained surgical hardware noted, previous bunionectomy, tailor's bunionectomy, Weil osteotomies of second and third metatarsals  Assessment/Plan of Care: 1. Capsulitis of foot   2. Metatarsalgia of right foot   3. Neuritis of right foot      No orders of the defined types were placed in this encounter.  MR FOOT RIGHT WO CONTRAST  Discussed clinical findings with patient today.  -Discussed suspicion for possible first interspace neuroma as she does have some reproducible symptoms here.  Corticosteroid injection offered to patient, patient refused at this time -MRI ordered to assess for any forefoot neuroma or other soft tissue abnormality of the right forefoot -Patient does have antalgic gait.  Recommend use of stiff soled shoe.  Metatarsal pad dispensed to the patient.  She does state that these have not been helpful for her in the past. -Patient deferring oral steroid. -Patient does have history of chronic pain -Follow-up after obtaining MRI results   Sameena Artus L. Lamount MAUL, AACFAS [facility]     [address]  [place], [place] [address]                Office [phone]  Fax [phone]

## 2023-01-31 ENCOUNTER — Encounter: Payer: Self-pay | Admitting: Podiatry

## 2023-02-12 ENCOUNTER — Ambulatory Visit
Admission: RE | Admit: 2023-02-12 | Discharge: 2023-02-12 | Disposition: A | Discharge status: Home / Self Care | Payer: No Typology Code available for payment source | Source: Ambulatory Visit | Attending: Podiatry | Admitting: Podiatry

## 2023-02-12 DIAGNOSIS — M7741 Metatarsalgia, right foot: Secondary | ICD-10-CM

## 2023-02-22 ENCOUNTER — Other Ambulatory Visit: Payer: Self-pay

## 2023-02-22 ENCOUNTER — Encounter (HOSPITAL_BASED_OUTPATIENT_CLINIC_OR_DEPARTMENT_OTHER): Payer: Self-pay | Admitting: Emergency Medicine

## 2023-02-22 ENCOUNTER — Other Ambulatory Visit (HOSPITAL_BASED_OUTPATIENT_CLINIC_OR_DEPARTMENT_OTHER): Payer: Self-pay

## 2023-02-22 ENCOUNTER — Emergency Department (HOSPITAL_BASED_OUTPATIENT_CLINIC_OR_DEPARTMENT_OTHER)
Admission: EM | Admit: 2023-02-22 | Discharge: 2023-02-22 | Disposition: A | Discharge status: Home / Self Care | Payer: No Typology Code available for payment source | Attending: Emergency Medicine | Admitting: Emergency Medicine

## 2023-02-22 DIAGNOSIS — R5382 Chronic fatigue, unspecified: Secondary | ICD-10-CM | POA: Diagnosis not present

## 2023-02-22 DIAGNOSIS — Z20822 Contact with and (suspected) exposure to covid-19: Secondary | ICD-10-CM | POA: Diagnosis not present

## 2023-02-22 DIAGNOSIS — J029 Acute pharyngitis, unspecified: Secondary | ICD-10-CM | POA: Diagnosis present

## 2023-02-22 DIAGNOSIS — Z91040 Latex allergy status: Secondary | ICD-10-CM | POA: Insufficient documentation

## 2023-02-22 LAB — CBC WITH DIFFERENTIAL/PLATELET
Abs Immature Granulocytes: 0 10*3/uL (ref 0.00–0.07)
Basophils Absolute: 0 10*3/uL (ref 0.0–0.1)
Basophils Relative: 1 %
Eosinophils Absolute: 0.1 10*3/uL (ref 0.0–0.5)
Eosinophils Relative: 2 %
HCT: 42.5 % (ref 36.0–46.0)
Hemoglobin: 14.4 g/dL (ref 12.0–15.0)
Immature Granulocytes: 0 %
Lymphocytes Relative: 49 %
Lymphs Abs: 1.6 10*3/uL (ref 0.7–4.0)
MCH: 30.9 pg (ref 26.0–34.0)
MCHC: 33.9 g/dL (ref 30.0–36.0)
MCV: 91.2 fL (ref 80.0–100.0)
Monocytes Absolute: 0.2 10*3/uL (ref 0.1–1.0)
Monocytes Relative: 6 %
Neutro Abs: 1.3 10*3/uL — ABNORMAL LOW (ref 1.7–7.7)
Neutrophils Relative %: 42 %
Platelets: 255 10*3/uL (ref 150–400)
RBC: 4.66 MIL/uL (ref 3.87–5.11)
RDW: 12.2 % (ref 11.5–15.5)
WBC: 3.2 10*3/uL — ABNORMAL LOW (ref 4.0–10.5)
nRBC: 0 % (ref 0.0–0.2)

## 2023-02-22 LAB — RESP PANEL BY RT-PCR (RSV, FLU A&B, COVID)  RVPGX2
Influenza A by PCR: NEGATIVE
Influenza B by PCR: NEGATIVE
Resp Syncytial Virus by PCR: NEGATIVE
SARS Coronavirus 2 by RT PCR: NEGATIVE

## 2023-02-22 LAB — BASIC METABOLIC PANEL
Anion gap: 7 (ref 5–15)
BUN: 13 mg/dL (ref 8–23)
CO2: 27 mmol/L (ref 22–32)
Calcium: 9.5 mg/dL (ref 8.9–10.3)
Chloride: 106 mmol/L (ref 98–111)
Creatinine, Ser: 0.88 mg/dL (ref 0.44–1.00)
GFR, Estimated: >60 mL/min (ref >60)
Glucose, Bld: 89 mg/dL (ref 70–99)
Potassium: 4.1 mmol/L (ref 3.5–5.1)
Sodium: 140 mmol/L (ref 135–145)

## 2023-02-22 LAB — GROUP A STREP BY PCR: Group A Strep by PCR: NOT DETECTED

## 2023-02-22 MED ORDER — FAMOTIDINE 40 MG PO TABS
40.0000 mg | ORAL_TABLET | Freq: Every day | ORAL | 0 refills | Status: AC
  Filled 2023-02-22: qty 30, 30d supply, fill #0

## 2023-02-22 MED ORDER — OMEPRAZOLE 20 MG PO CPDR
20.0000 mg | DELAYED_RELEASE_CAPSULE | Freq: Two times a day (BID) | ORAL | 0 refills | Status: AC
  Filled 2023-02-22: qty 60, 30d supply, fill #0

## 2023-02-22 MED ORDER — LIDOCAINE VISCOUS HCL 2 % MT SOLN
15.0000 mL | OROMUCOSAL | 0 refills | Status: DC | PRN
  Filled 2023-02-22: qty 100, 7d supply, fill #0

## 2023-02-22 NOTE — ED Provider Notes (Signed)
 [place] EMERGENCY DEPARTMENT AT [facility] Acute Care Provider Note   CSN: [id number] Arrival date & time: 02/22/23  1001     History  Chief Complaint  Patient presents with   Sore Throat    Katrina Marquez is a 63 y.o. female.   Sore Throat  Patient is a 63 year old female presents the ED today complaining of a sore throat that has been present for years that has been getting worse over the last 6 months.  States that she has been seen many times by previous ENTs and told that nothing has been unremarkable.  Previous medical history of thyroid  nodules, kidney cyst, hepatic cyst, brain lesion, HSV, laryngeal pharyngeal reflux disease.  Describes pain as burning and accompanied with fatigue.  Is tolerating food and secretions and fluids.  Said that she is not taking any of her GERD medication in years.  Has not seen ENT for the last year and a half.  Denies fever, headache, ear pain, vertigo, chest pain, shortness of breath, abdominal pain, nausea, vomiting, diarrhea.      Home Medications Prior to Admission medications   Medication Sig Start Date End Date Taking? Authorizing Provider  famotidine  (PEPCID ) 40 MG tablet Take 1 tablet (40 mg total) by mouth daily. 02/22/23  Yes Kelleigh Skerritt S, PA-C  lidocaine  (XYLOCAINE ) 2 % solution Use as directed 15 mLs in the mouth or throat as needed for mouth pain. 02/22/23  Yes Sartaj Hoskin S, PA-C  benzonatate  (TESSALON ) 100 MG capsule Take 1 capsule (100 mg total) by mouth 3 (three) times daily as needed. 09/08/22   Kennyth Domino, FNP  chlorhexidine (PERIDEX) 0.12 % solution PLEASE SEE ATTACHED FOR DETAILED DIRECTIONS    [provider]  fluticasone  (FLONASE ) 50 MCG/ACT nasal spray Place 2 sprays into both nostrils daily. 09/08/22   Kennyth Domino, FNP  omeprazole  (PRILOSEC) 20 MG capsule Take 1 capsule (20 mg total) by mouth 2 (two) times daily. 02/22/23   Rowdy Guerrini S, PA-C  valACYclovir (VALTREX) 500 MG tablet Take 500 mg by  mouth 2 (two) times daily. 04/03/20   [provider]  venlafaxine  XR (EFFEXOR -XR) 75 MG 24 hr capsule Take 75 mg by mouth daily. 07/27/16   [provider]      Allergies    Fish allergy, Tomato, Povidone iodine, Soap, Latex, and Other    Review of Systems   Review of Systems  Constitutional:  Positive for fatigue.  HENT:  Positive for sore throat.     Physical Exam Updated Vital Signs BP 134/74 (BP Location: Right Arm)   Pulse 80   Temp 98 F (36.7 C) (Oral)   Resp 16   LMP 10/18/2011   SpO2 99%  Physical Exam Vitals and nursing note reviewed.  Constitutional:      Appearance: Normal appearance.  HENT:     Head: Normocephalic and atraumatic.     Nose: No congestion or rhinorrhea.     Mouth/Throat:     Mouth: Mucous membranes are moist. No oral lesions.     Pharynx: Oropharynx is clear. Uvula midline. No pharyngeal swelling, oropharyngeal exudate, posterior oropharyngeal erythema or uvula swelling.     Tonsils: No tonsillar exudate or tonsillar abscesses.  Eyes:     Extraocular Movements: Extraocular movements intact.     Conjunctiva/sclera: Conjunctivae normal.  Neck:     Thyroid : No thyromegaly.  Cardiovascular:     Rate and Rhythm: Normal rate and regular rhythm.     Pulses: Normal pulses.  Heart sounds: Normal heart sounds. No murmur heard.    No friction rub. No gallop.  Pulmonary:     Effort: Pulmonary effort is normal. No respiratory distress.     Breath sounds: Normal breath sounds.  Abdominal:     General: Abdomen is flat.     Palpations: Abdomen is soft.     Tenderness: There is no abdominal tenderness.  Skin:    General: Skin is warm and dry.  Neurological:     General: No focal deficit present.     Mental Status: She is alert. Mental status is at baseline.  Psychiatric:        Mood and Affect: Mood normal.     ED Results / Procedures / Treatments   Labs (all labs ordered are listed, but only abnormal results are  displayed) Labs Reviewed  CBC WITH DIFFERENTIAL/PLATELET - Abnormal; Notable for the following components:      Result Value   WBC 3.2 (*)    Neutro Abs 1.3 (*)    All other components within normal limits  RESP PANEL BY RT-PCR (RSV, FLU A&B, COVID)  RVPGX2  GROUP A STREP BY PCR  BASIC METABOLIC PANEL    EKG None  Radiology No results found.  Procedures Procedures    Medications Ordered in ED Medications - No data to display  ED Course/ Medical Decision Making/ A&P                               Medical Decision Making Amount and/or Complexity of Data Reviewed Labs: ordered.   This patient is a 63 year old female who presents to the ED for concern of sore throat and dysphagia.   Differential diagnoses prior to evaluation: The emergent differential diagnosis includes, but is not limited to, HSV, strep pharyngitis, GERD, LPRD, tumor,. This is not an exhaustive differential.   Past Medical History / Co-morbidities / Social History: Brain lesions, kidney cyst, hepatic cyst, HSV, thyroid  nodule, laryngal pharyngeal reflux disease, colonic polyps  Additional history: Chart reviewed. Pertinent results include:  Last seen on 03/08/2022 by ENT with same symptoms.  Patient underwent flexible fiberoptic endoscopy that was only notable for previously biopsied benign spongiform nodules on the right thyroid  gland.   Lab Tests/Imaging studies: I personally interpreted labs/imaging and the pertinent results include:   CBC unremarkable BMP unremarkable Respiratory panel unremarkable Group A strep unremarkable  Consider doing CT imaging however do not believe that there is any emergent process at this time warranting.    Medications: No medications needed for this visit.  I have reviewed the patients home medicines and have made adjustments as needed.   ED Course:   Patient is a 63 year old female [place] the ED today complaining of chronic sore throat and mild dysphagia that  has been increasing over the last 6 months.  This is accompanied with increased fatigue.  Patient open previously seen by ENT and shown to not have any acute causes for the symptoms.  Was placed on a PPI but had not been taking it.  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  Physical exam was unremarkable  Due to patient's symptoms being worse at night, I believe this patient is most likely experiencing GERD like symptoms as pain is described as burning pain and she has not been taking her GERD medication.  Will prescribe viscous lidocaine  as well as famotidine  and PPI and  have her follow-up with her PCP for possible thyroid  labs for her chronic fatigue as well as an ENT for further evaluation of her throat and pain.  Patient vital signs remained stable at the course of her time here.  And her labs were entirely unremarkable.  Low suspicion for any emergent or infectious etiology at this time.  Patient expressed agreement understanding of plan.  Provided strict return ED precautions.  I believe patient stated discharge at this time.   Disposition: After consideration of the diagnostic results and the patients response to treatment, I feel that patient moved from discharge and treatment as above.   emergency department workup does not suggest an emergent condition requiring admission or immediate intervention beyond what has been performed at this time. The plan is: Famotidine , PPI, viscous lidocaine , follow-up with ENT for continued workup of sore throat, follow-up with PCP for continued workup of fatigue return for any new or worsening symptoms. The patient is safe for discharge and has been instructed to return immediately for worsening symptoms, change in symptoms or any other concerns.  Final Clinical Impression(s) / ED Diagnoses Final diagnoses:  Sore throat  Chronic fatigue    Rx / DC Orders ED Discharge Orders          Ordered     lidocaine  (XYLOCAINE ) 2 % solution  As needed        02/22/23 1242    omeprazole  (PRILOSEC) 20 MG capsule  2 times daily        02/22/23 1242    famotidine  (PEPCID ) 40 MG tablet  Daily        02/22/23 1242              Florinda Taflinger S, PA-C 02/22/23 1303    Tonia Chew, MD 02/22/23 1600

## 2023-02-22 NOTE — Discharge Instructions (Addendum)
 You were seen today for sore throat and fatigue that has been present for a few years but worse over the last 6 months.  Low suspicion for any emergent pathology present at this time.  Due to your symptoms and the length of symptoms, I believe that you would be best cared for by an ENT who would be able to take a deeper look into your problems.  Your labs and physical exam were very reassuring today.  I am provided you with mother ENT to get in touch with you and see if you might have better luck with this 1 for possible treatment and evaluation.  I also believe that you would be best to follow-up with your PCP to get repeat labs for your thyroid  as this might be a cause for your extreme fatigue and have not had thyroid  checked in a while.

## 2023-02-22 NOTE — ED Triage Notes (Signed)
 Sore throat, reports swollen glands hard to swallow "Getting worse and worse over the years" Denies new symptoms Protecting own airway, swallowing secretions now drooling

## 2023-03-08 ENCOUNTER — Other Ambulatory Visit: Payer: Self-pay | Admitting: Endocrinology

## 2023-03-08 DIAGNOSIS — E041 Nontoxic single thyroid nodule: Secondary | ICD-10-CM

## 2023-03-14 ENCOUNTER — Telehealth: Payer: Self-pay | Admitting: Podiatry

## 2023-03-14 ENCOUNTER — Inpatient Hospital Stay: Admission: RE | Admit: 2023-03-14 | Payer: No Typology Code available for payment source | Source: Ambulatory Visit

## 2023-03-14 NOTE — Telephone Encounter (Signed)
 Pt called and is having a hard time walking because she cannot feel her toes on her right foot. She called out of work today because of this.  Hx.second and third metatarsal osteotomy date of surgery was 12/03/2021  She is needing to know what she needs to do. Please advise

## 2023-03-15 ENCOUNTER — Ambulatory Visit
Admission: RE | Admit: 2023-03-15 | Discharge: 2023-03-15 | Disposition: A | Discharge status: Home / Self Care | Payer: No Typology Code available for payment source | Source: Ambulatory Visit | Attending: Endocrinology | Admitting: Endocrinology

## 2023-03-15 DIAGNOSIS — E041 Nontoxic single thyroid nodule: Secondary | ICD-10-CM

## 2023-03-23 ENCOUNTER — Ambulatory Visit: Admitting: Podiatry

## 2023-03-23 ENCOUNTER — Encounter: Payer: Self-pay | Admitting: Podiatry

## 2023-03-23 DIAGNOSIS — R200 Anesthesia of skin: Secondary | ICD-10-CM | POA: Insufficient documentation

## 2023-03-23 DIAGNOSIS — G5791 Unspecified mononeuropathy of right lower limb: Secondary | ICD-10-CM

## 2023-03-23 DIAGNOSIS — M5410 Radiculopathy, site unspecified: Secondary | ICD-10-CM | POA: Diagnosis not present

## 2023-03-23 NOTE — Progress Notes (Addendum)
 She presents today for follow-up of her neuritis on the right foot.  She states that she still complaining of pain and numbness at the big toe and feels like she is walking on a ball on the ball of the foot.  She complains about a rolled up sock sensation beneath her forefoot and that she has numbness i.e. pins-and-needles that are extending from her second third fourth toes of her right foot up her anterior leg and approaching her knee.  She states that this has gotten worse lately she says that sitting seems to make it worse and then it seems to be worse at nighttime.  She has had a history of right side pain including neck shoulder and lower extremity pain.  She relates that after her bunion repair hammertoe repair second metatarsal repair if she no longer had shoulder neck and back pain.  She states that if her foot were feeling better she would be 100% improved.  She no longer takes Keppra or gabapentin.  She is currently taking nothing for her symptomatology.  Objective: Vital signs are stable she is alert and oriented x 3.  Pulses are palpable.  Neurologic sensorium appears to be intact she has some tenderness on palpation of the tibial sesamoid which the MRI relates is developing bony edema.  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  She has no increase in pain or loss of sensation with range of motion of the toes however she does have pain on palpation of the dorsal aspect of the foot radiating distally and proximally as well as the ankle and lower leg.  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  I do not think the lateral compensation is the issue here.  Could consider removing internal fixation as we did in the left foot when it was painful.  I do not think this would help her with her paresthesias.  Plan: Going request neurology consult  with EMG and nerve conduction velocity exams.  This will help rule out radiculopathy and nerve compression syndrome.  Follow-up with her once those results come in.  I did start her on methylprednisolone to be followed by meloxicam.

## 2023-04-18 ENCOUNTER — Telehealth: Payer: Self-pay

## 2023-04-18 NOTE — Telephone Encounter (Signed)
 Patient called and left a lengthy message. States that her insurance will not cover the nerve conduction test and that it is very expensive. She is upset because she didn't have the numbness and pain before surgery(????) She feels strongly that she should not have to pay for anything that has to do with these "symptoms caused by surgery" DOS 12/03/2021, right foot, neuritis. She is very frustrated -  not sure what to tell her. thanks

## 2023-05-04 ENCOUNTER — Encounter (INDEPENDENT_AMBULATORY_CARE_PROVIDER_SITE_OTHER): Payer: Self-pay | Admitting: Otolaryngology

## 2023-05-04 ENCOUNTER — Ambulatory Visit (INDEPENDENT_AMBULATORY_CARE_PROVIDER_SITE_OTHER): Payer: No Typology Code available for payment source | Admitting: Otolaryngology

## 2023-05-04 VITALS — BP 114/71 | HR 96

## 2023-05-04 DIAGNOSIS — K219 Gastro-esophageal reflux disease without esophagitis: Secondary | ICD-10-CM | POA: Diagnosis not present

## 2023-05-04 DIAGNOSIS — J342 Deviated nasal septum: Secondary | ICD-10-CM

## 2023-05-04 DIAGNOSIS — K143 Hypertrophy of tongue papillae: Secondary | ICD-10-CM

## 2023-05-04 DIAGNOSIS — J3089 Other allergic rhinitis: Secondary | ICD-10-CM

## 2023-05-04 DIAGNOSIS — J343 Hypertrophy of nasal turbinates: Secondary | ICD-10-CM

## 2023-05-04 DIAGNOSIS — J312 Chronic pharyngitis: Secondary | ICD-10-CM | POA: Diagnosis not present

## 2023-05-04 DIAGNOSIS — R0982 Postnasal drip: Secondary | ICD-10-CM

## 2023-05-04 DIAGNOSIS — R0981 Nasal congestion: Secondary | ICD-10-CM

## 2023-05-04 MED ORDER — CETIRIZINE HCL 10 MG PO TABS
10.0000 mg | ORAL_TABLET | Freq: Every day | ORAL | 11 refills | Status: AC
Start: 2023-05-04 — End: ?

## 2023-05-04 MED ORDER — FLUTICASONE PROPIONATE 50 MCG/ACT NA SUSP: 2.0000 | Freq: Every day | NASAL | 6 refills | Status: AC

## 2023-05-04 NOTE — Progress Notes (Signed)
 ENT CONSULT:  Reason for Consult: sore throat  x several years and persistent tongue coating  HPI: Discussed the use of AI scribe software for clinical note transcription with the patient, who gave verbal consent to proceed.  History of Present Illness Katrina Marquez is a 63 year old female who presents with a persistent coating on her tongue and throat discomfort present for many years.   She has a persistent coating on her tongue, described as thick and grayish-green, most noticeable in the morning. This has been ongoing for years and is worsening. She has consulted various specialists, including a dentist, but has not received a definitive diagnosis or effective treatment.  She experiences discomfort in her throat, described as a feeling of swelling or puffiness under her chin, possibly in the salivary glands. She has no history of heartburn but has been taking reflux medication without experiencing heartburn symptoms (given to her when he went to ED in Feb 2025 for similar sx). She has not found relief from her symptoms with this medication.  She has a history of postnasal drip, which she believes may be contributing to her symptoms. She reports a terrible taste in her mouth upon waking, which she suspects is related to the postnasal drip. She has not been formally tested for allergies.  She has tried a nasal spray during a previous COVID-19 infection, which she did not find helpful. She has not used any specific treatments for her current symptoms aside from the reflux medication and a thick clear liquid given for pain, likely viscous lidocaine, but she denies pain with swallowing or significant oral or throat pain and does not take it.   Records Reviewed:  ED visit 02/22/2023 Patient is a 63 year old female presents the ED today complaining of a sore throat that has been present for "years" that has been getting worse over the last 6 months.  States that she has been seen many times by  previous ENTs and told that nothing has been unremarkable.  Previous medical history of thyroid nodules, kidney cyst, hepatic cyst, brain lesion, HSV, laryngeal pharyngeal reflux disease.  Describes pain as "burning" and accompanied with fatigue.  Is tolerating food and secretions and fluids.   York Spaniel that she is not taking any of her GERD medication in years.  Has not seen ENT for the last year and a half.   Denies fever, headache, ear pain, vertigo, chest pain, shortness of breath, abdominal pain, nausea, vomiting, diarrhea.      Past Medical History:  Diagnosis Date   Allergy    Chronic pain    History of depression    Thyroid disease     Past Surgical History:  Procedure Laterality Date   MYOMECTOMY      Family History  Problem Relation Age of Onset   CAD Mother    CAD Father    Thyroid disease Neg Hx     Social History:  reports that she has never smoked. She has never been exposed to tobacco smoke. She has never used smokeless tobacco. She reports current alcohol use. She reports that she does not use drugs.  Allergies:  Allergies  Allergen Reactions   Fish Allergy Anaphylaxis   Tomato Anaphylaxis   Povidone Iodine     itching Other reaction(s): Unknown   Soap     Other reaction(s): Rash  [employer] Soaps   Latex Rash   Other Rash    Medications: I have reviewed the patient's current medications.  The PMH, PSH,  Medications, Allergies, and SH were reviewed and updated.  ROS: Constitutional: Negative for fever, weight loss and weight gain. Cardiovascular: Negative for chest pain and dyspnea on exertion. Respiratory: Is not experiencing shortness of breath at rest. Gastrointestinal: Negative for nausea and vomiting. Neurological: Negative for headaches. Psychiatric: The patient is not nervous/anxious  Blood pressure 114/71, pulse 96, last menstrual period 10/18/2011, SpO2 97%. There is no height or weight on file to calculate BMI.  PHYSICAL  EXAM:  Exam: General: Well-developed, well-nourished Respiratory Respiratory effort: Equal inspiration and expiration without stridor Cardiovascular Peripheral Vascular: Warm extremities with equal color/perfusion Eyes: No nystagmus with equal extraocular motion bilaterally Neuro/Psych/Balance: Patient oriented to person, place, and time; Appropriate mood and affect; Gait is intact with no imbalance; Cranial nerves I-XII are intact Head and Face Inspection: Normocephalic and atraumatic without mass or lesion Palpation: Facial skeleton intact without bony stepoffs Salivary Glands: No mass or tenderness Facial Strength: Facial motility symmetric and full bilaterally ENT Pinna: External ear intact and fully developed External canal: Canal is patent with intact skin Tympanic Membrane: Clear and mobile External Nose: No scar or anatomic deformity Internal Nose: Septum is deviated to the right with a large spur. No polyp, or purulence. Mucosal edema and erythema present.  Clear post-nasal drainage.  Bilateral inferior turbinate hypertrophy.  Lips, Teeth, and gums: Mucosa and teeth intact and viable TMJ: No pain to palpation with full mobility Oral cavity/oropharynx: No erythema or exudate, no lesions present Nasopharynx: No mass or lesion with intact mucosa Hypopharynx: Intact mucosa without pooling of secretions Larynx Glottic: Full true vocal cord mobility without lesion or mass Supraglottic: Normal appearing epiglottis and AE folds Interarytenoid Space: Moderate pachydermia&edema Subglottic Space: Patent without lesion or edema Neck Neck and Trachea: Midline trachea without mass or lesion Thyroid: No mass or nodularity Lymphatics: No lymphadenopathy  Procedure: Preoperative diagnosis: sore throat tongue coating in am  Postoperative diagnosis:   Same + GERD LPR and post-nasal drainage   Procedure: Flexible fiberoptic laryngoscopy  Surgeon: Artice Last, MD  Anesthesia:  Topical lidocaine and Afrin Complications: None Condition is stable throughout exam  Indications and consent:  The patient presents to the clinic with above symptoms. Indirect laryngoscopy view was incomplete. Thus it was recommended that they undergo a flexible fiberoptic laryngoscopy. All of the risks, benefits, and potential complications were reviewed with the patient preoperatively and verbal informed consent was obtained.  Procedure: The patient was seated upright in the clinic. Topical lidocaine and Afrin were applied to the nasal cavity. After adequate anesthesia had occurred, I then proceeded to pass the flexible telescope into the nasal cavity. The nasal cavity was patent without rhinorrhea or polyp. The nasopharynx was also patent without mass or lesion. The base of tongue was visualized and was normal. There were no signs of pooling of secretions in the piriform sinuses. The true vocal folds were mobile bilaterally. There were no signs of glottic or supraglottic mucosal lesion or mass. There was moderate interarytenoid pachydermia and post cricoid edema. The telescope was then slowly withdrawn and the patient tolerated the procedure throughout.    PROCEDURE NOTE: nasal endoscopy  Preoperative diagnosis: chronic post-nasal drainage symptoms  Postoperative diagnosis: same + septal deviation and ITH  Procedure: Diagnostic nasal endoscopy (78295)  Surgeon: Artice Last, M.D.  Anesthesia: Topical lidocaine and Afrin  H&P REVIEW: The patient's history and physical were reviewed today prior to procedure. All medications were reviewed and updated as well. Complications: None Condition is stable throughout exam Indications and consent: The  patient presents with symptoms of chronic sinusitis not responding to previous therapies. All the risks, benefits, and potential complications were reviewed with the patient preoperatively and informed consent was obtained. The time out was completed  with confirmation of the correct procedure.   Procedure: The patient was seated upright in the clinic. Topical lidocaine and Afrin were applied to the nasal cavity. After adequate anesthesia had occurred, the rigid nasal endoscope was passed into the nasal cavity. The nasal mucosa, turbinates, septum, and sinus drainage pathways were visualized bilaterally. This revealed no purulence or significant secretions that might be cultured. There were no polyps or sites of significant inflammation. The mucosa was intact and there was no crusting present. The scope was then slowly withdrawn and the patient tolerated the procedure well. There were no complications or blood loss.   Studies Reviewed: U/S thyroid 03/15/23 Right lobe: 4.8 x 1.8 x 1.9 cm   Left lobe: 3.9 x 1.1 x 1.8 cm   _________________________________________________________   Estimated total number of nodules >/= 1 cm: 2   Number of spongiform nodules >/=  2 cm not described below (TR1): 0   Number of mixed cystic and solid nodules >/= 1.5 cm not described below (TR2): 0   _________________________________________________________   Nodule 1: Similar appearance of previously biopsied nodule in the right mid gland. The lesion measures 2.2 x 1.5 x 1.2 cm compared to 1.9 x 1.6 x 1.1 cm in 2022. No significant interval change.   Nodule # 2: Similarly, the previously biopsied nodule in the right lower gland is grossly unchanged at 2.4 x 1.8 x 1.4 cm compared to 2.5 x 1.8 x 1.4 cm previously.   No new nodules or suspicious features.   IMPRESSION: No significant interval change in the size or appearance of previously biopsied nodules in the right mid and right lower gland. Assuming previously benign biopsy diagnosis, no further imaging follow-up is recommended.   No new nodules or suspicious features.  Assessment/Plan: Encounter Diagnoses  Name Primary?   Chronic sore throat    Post-nasal drip    Environmental and seasonal  allergies    Chronic nasal congestion    Chronic GERD    Nasal septal deviation    Hypertrophy of both inferior nasal turbinates    Brown hairy tongue Yes    Assessment and Plan Assessment & Plan Postnasal drip  Chronic postnasal drainage likely contributing to throat irritation. Possible allergic component without formal testing unable to tell for certain. Discussed allergy testing but she would like to hold off. Will do empirin management of sx with antihistamine and nasal spray. Nasal endoscopy today with NSD and ITH but no purulence or pus. There was clear post-nasal drainage present as well.  - Use Flonase nasal spray and Zyrtec as prescribed - Perform nasal irrigation with neti pot or NeilMed bottle before Flonase. - Consider allergy clinic referral for formal testing.   Throat discomfort globus Suspected  GERD and laryngopharyngeal reflux (LPR) Flexible scope exam and oral exam are reassuring, but she did have post-cricoid edema pachydermia c/w silent reflux, could potentially contribute to her sx.  - Continue Prilosec 20 mg daily. - Provided handout on reflux management and dietary modifications. - Recommend Reflux Gourmet supplement post meals.  Coating on tongue suspect "hairy tongue" Chronic grayish-green coating each morning, which she did not have on exam today. Differential includes dietary factors, medication side effects, or oral bacteria changes. Condition benign. Patient was reassured - Switch to gentle toothpaste and mouthwash like  TheraBreath. - Implement dietary modifications, including probiotics or live cultured yogurt. - Gargle with salt water. - Research dietary changes for hairy tongue online.   Thank you for allowing me to participate in the care of this patient. Please do not hesitate to contact me with any questions or concerns.   Artice Last, MD Otolaryngology [facility] Phone: [phone] Fax: [phone]    05/04/2023,  9:17 AM

## 2023-05-04 NOTE — Patient Instructions (Addendum)
-   TheraBite mouth wash   - research "Hairy Tongue" online   - Take Reflux Gourmet (natural supplement available on Amazon) to help with symptoms of chronic throat irritation      GamingLesson.nl - check out this website to learn more about reflux   -Avoid lying down for at least two hours after a meal or after drinking acidic beverages, like soda, or other caffeinated beverages. This can help to prevent stomach contents from flowing back into the esophagus. -Keep your head elevated while you sleep. Using an extra pillow or two can also help to prevent reflux. -Eat smaller and more frequent meals each day instead of a few large meals. This promotes digestion and can aid in preventing heartburn. -Wear loose-fitting clothes to ease pressure on the stomach, which can worsen heartburn and reflux. -Reduce excess weight around the midsection. This can ease pressure on the stomach. Such pressure can force some stomach contents back up the esophagus   Andres Bangs Med Nasal Saline Rinse   - start nasal saline rinses with NeilMed Bottle available over the counter or online to help with nasal congestion

## 2023-05-19 IMAGING — US US THYROID
1 series · 13 of 25 positions shown · non-contrast
Comparison: 11/10/2017

CLINICAL DATA: Prior ultrasound follow-up. Previous FNA biopsy of
right mid and lower nodules 08/06/2015.

EXAM:
THYROID ULTRASOUND
TECHNIQUE: Ultrasound examination of the thyroid gland and adjacent soft
tissues was performed.

[Series 1: us thyroid · 0.05mm/px · 13 of 55 slices shown]
[im 1/55]
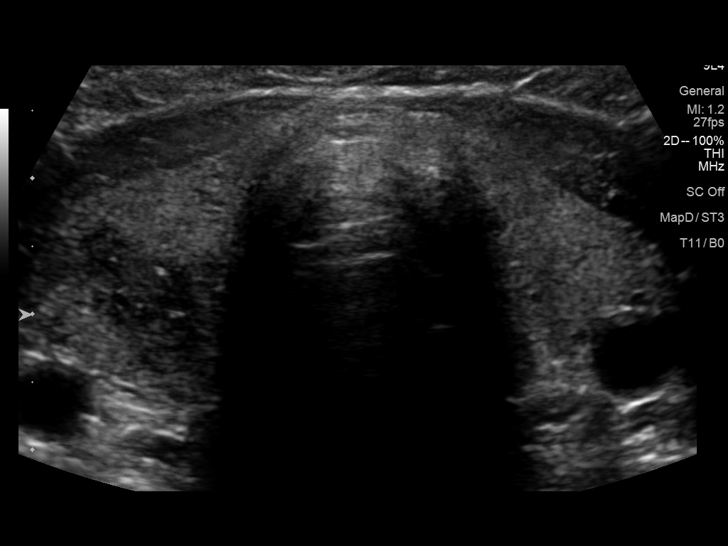
[im 5/55]
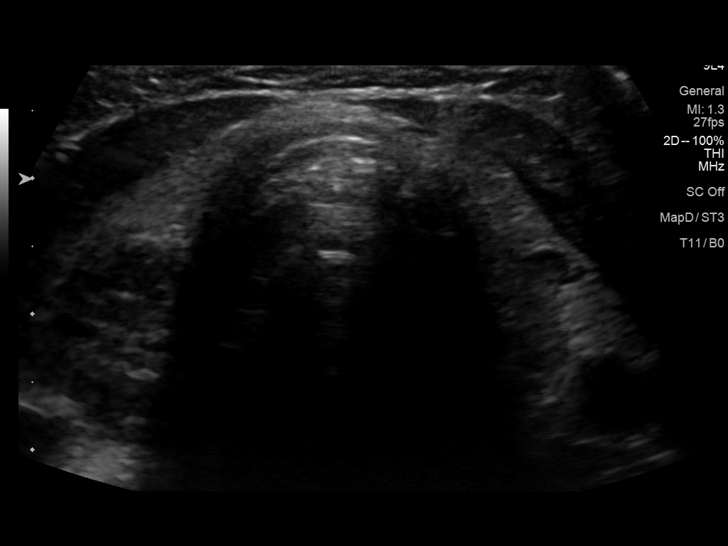
[im 10/55]
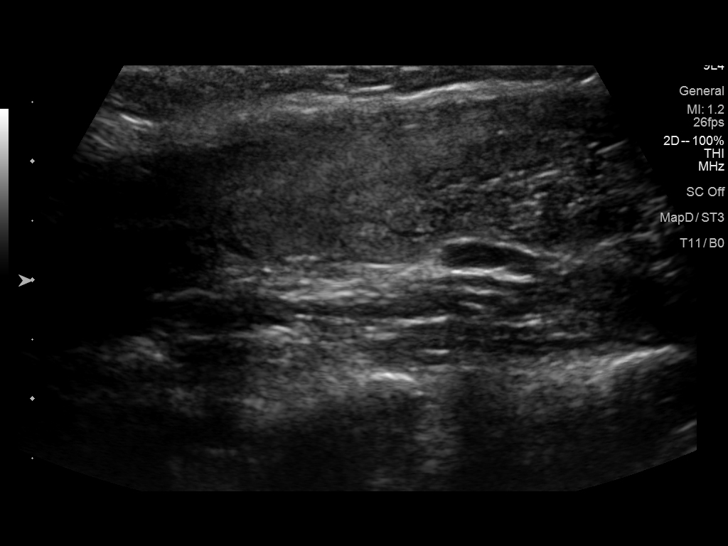
[im 14/55]
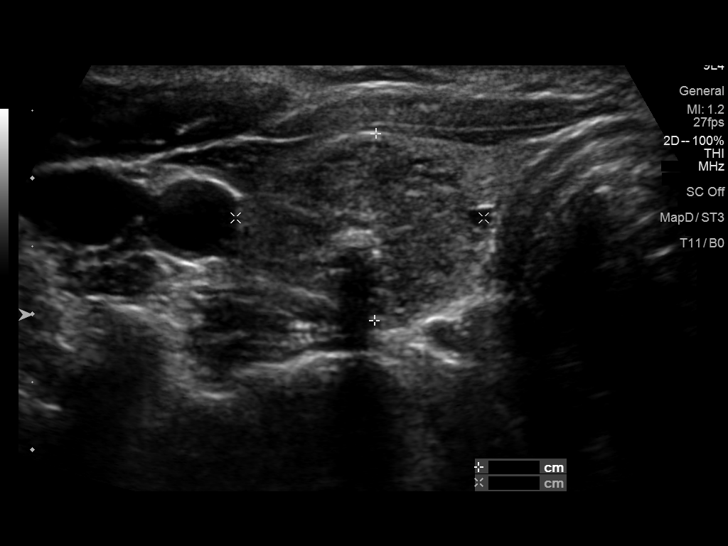
[im 19/55]
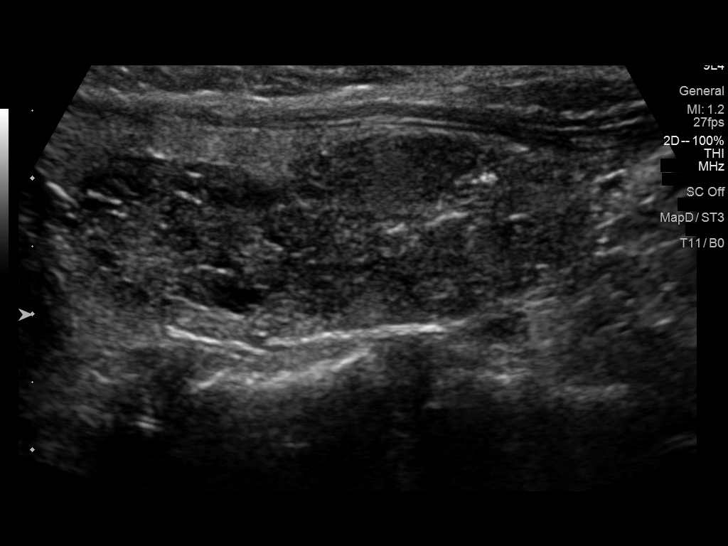
[im 23/55]
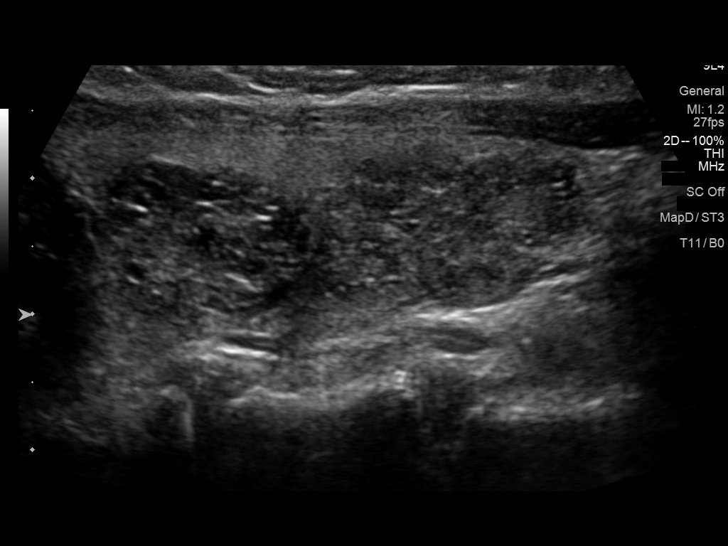
[im 28/55]
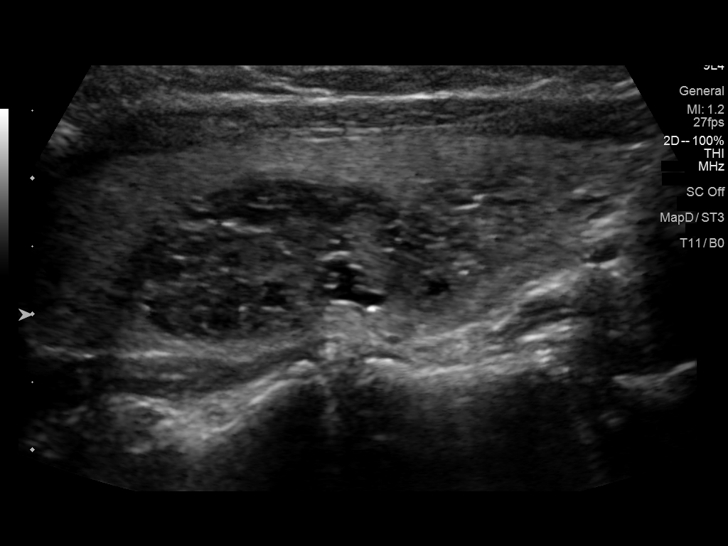
[im 32/55]
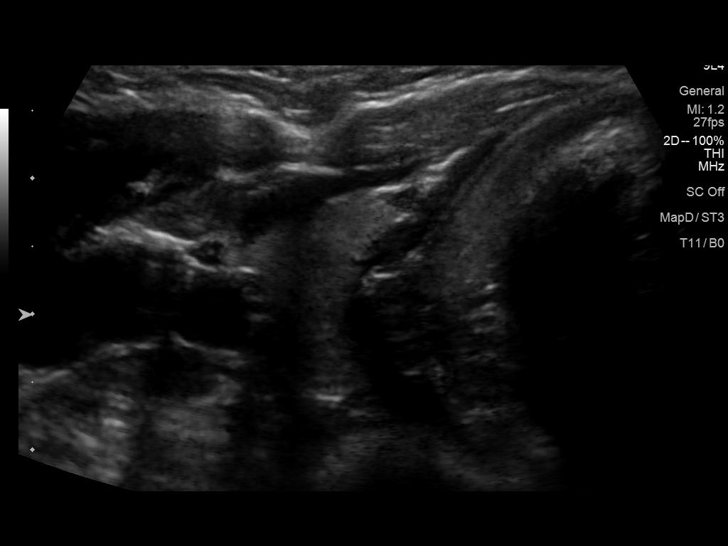
[im 37/55]
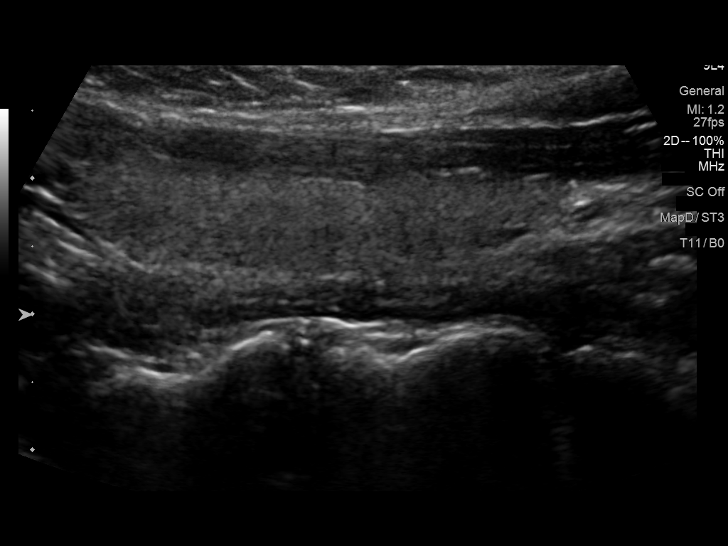
[im 41/55]
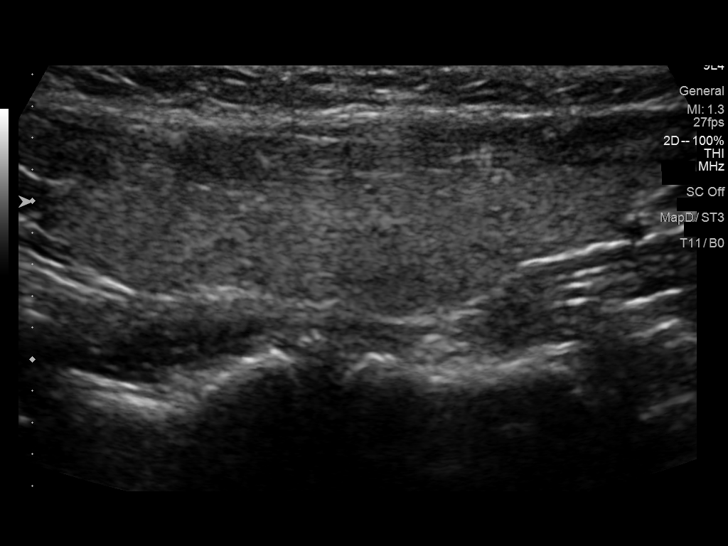
[im 46/55]
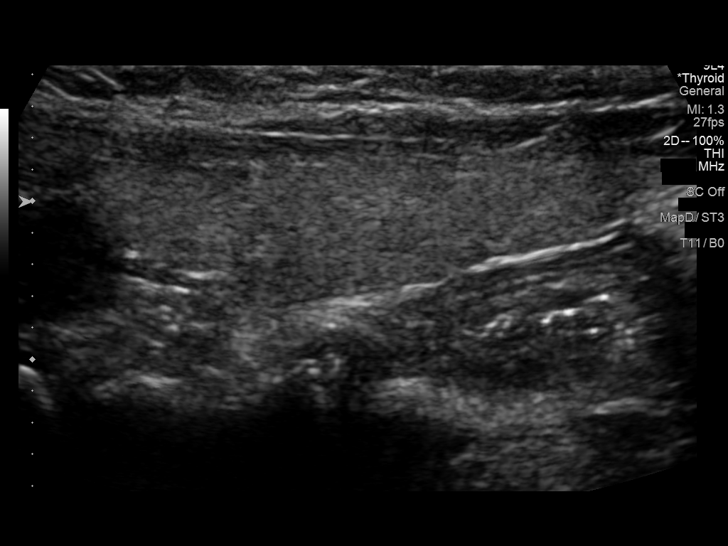
[im 50/55]
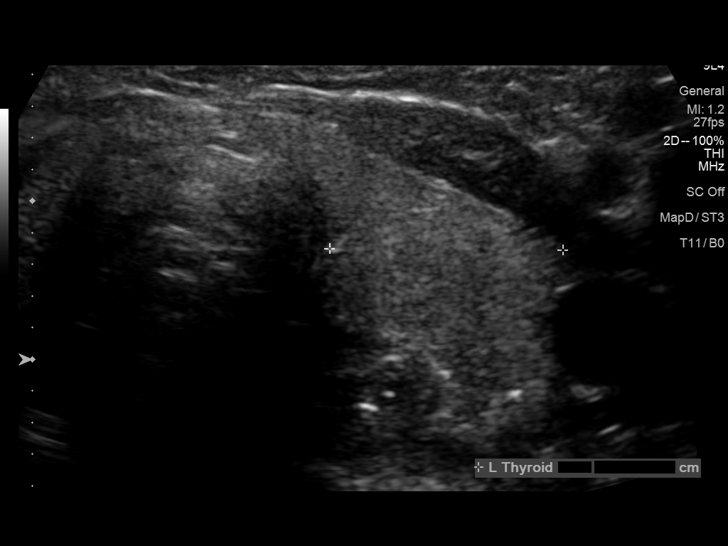
[im 55/55]
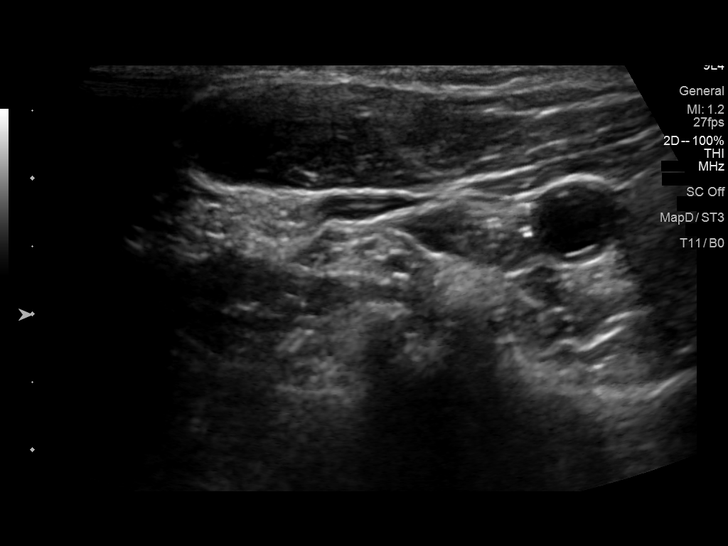

[13 of 25 positions shown; findings below may reference images not displayed]

FINDINGS: Parenchymal Echotexture: Mildly heterogenous

Isthmus: 0.2 cm, previously 0.2 cm

Right lobe: 5.0 x 1.5 x 2.0 cm, previously 5.0 x 1.5 x 2.0 cm

Left lobe: 4.4 x 1.1 x 1.5 cm, previously 3.9 x 0.8 x 0.7 cm

_________________________________________________________

Estimated total number of nodules >/= 1 cm: 2

Number of spongiform nodules >/=  2 cm not described below (TR1): 0

Number of mixed cystic and solid nodules >/= 1.5 cm not described
below (TR2): 0

_________________________________________________________

Previously biopsied right midpole spongiform nodule appears similar
measuring up to 1.9 cm, previously 1.7 cm.

Previously biopsied right inferior spongiform nodule appears similar
measuring up to 2.5 cm, previously 2.2 cm. No new discrete nodules.

No cervical lymphadenopathy.
IMPRESSION: Unchanged appearance of previously biopsied benign spongiform
nodules in the right thyroid gland. No new discrete nodules.

## 2023-05-24 ENCOUNTER — Telehealth: Payer: Self-pay | Admitting: Podiatry

## 2023-05-24 NOTE — Telephone Encounter (Signed)
 Patient is calling stating she is having issues with her circulation she believes. Patiet states she sent a message and has never heard back, I relayed to pt that it looks like Dr Lara Plants was referring to neurology. Patient states she can't afford to go to neurology but she has something wrong with her circulation and she wants to let Dr Lara Plants know something is definitely wrong. She is having difficulty walking.

## 2023-06-26 ENCOUNTER — Telehealth: Payer: Self-pay | Admitting: Podiatry

## 2023-06-26 NOTE — Telephone Encounter (Signed)
 Patient stated she has called and left messages on nurse voicemail and no response. Patient right foot feels like she is walking on rubber balls and losing feelings in her toes. As per patient does not wish to schedule due to not being satisfied with provider assisting her with issue. She was screaming and upset due to not getting satisfactory treatment from provider. Patient was advised, the information would be sent as a high priority message, She said okay, and hung up. Patient contact telephone number, [phone] wSas screaming and upset due to not getting satisfactory treatment from provider.

## 2023-06-27 NOTE — Telephone Encounter (Signed)
 Patient informed of need for nerve conduction study. Patient says "feels like I am walking on a cut in half tennis ball." I let her know again that she needed to keep appointment with neuro. She then asked "if we were going to help her pay for the test". I informed her that this is not how that works and that we would not be helping her pay for that test. She told me "Dr. Lara Plants must have done something wrong in the surgery because my other foot is not like this." Patient informed of need to be contacted by practice manager and she told me that "if I cared then I would tell my manager that she is limping now and she was not when she came in here. If I really cared I would tell my manager that." I explained that I do care which is why I am having my manager reach out then patient informed me "stop giving me excuses." Thanked patient for her time and ended call.

## 2023-07-13 NOTE — Telephone Encounter (Signed)
 I have spoken with Katrina Marquez on 6/19, 6/23 and 6/26.  Appointment has been made to see Dr. Juliene Medicine on 07/18/23.  Katrina Marquez states she is retired, concerned about her right foot and wants to continue to be active.  States she has no feeling underneath her right toes from time to time. Difficultly sleeping as toes touch sheets. She is being seen by neurology 07/14/23.  She is frusturated as feels no one is listening to her and her concerns.  She wants to continue to be treated and is agreeable to see Dr. Medicine in the practice.

## 2023-07-14 ENCOUNTER — Ambulatory Visit (INDEPENDENT_AMBULATORY_CARE_PROVIDER_SITE_OTHER): Payer: Self-pay | Admitting: Diagnostic Neuroimaging

## 2023-07-14 ENCOUNTER — Encounter: Payer: Self-pay | Admitting: Diagnostic Neuroimaging

## 2023-07-14 VITALS — BP 108/59 | HR 80 | Ht 60.5 in | Wt 154.4 lb

## 2023-07-14 DIAGNOSIS — M79672 Pain in left foot: Secondary | ICD-10-CM | POA: Diagnosis not present

## 2023-07-14 DIAGNOSIS — M79671 Pain in right foot: Secondary | ICD-10-CM

## 2023-07-14 NOTE — Patient Instructions (Signed)
 RIGHT FOOT PAIN (since surgery in 2020, worsened in 2023; could be post-operative complication vs underlying polyneuropathy; no evidence of lumbar radiculopathy on exam)  - CHECK neuropathy labs  - Consider painful neuropathy treatment options: -duloxetine 30-60mg  daily, amitriptyline 25-50mg  at bedtime, gabapentin 100-300mg  three times a day, pregabalin 75-150mg  twice a day -capsaicin cream, lidocaine  patch / cream, alpha-lipoic acid 600mg  daily

## 2023-07-14 NOTE — Progress Notes (Signed)
 [facility]  PATIENT: Katrina Marquez DOB: 1960/05/19  REFERRING CLINICIAN: Hyatt, Max T, DPM HISTORY FROM: patient  REASON FOR VISIT: new consult   HISTORICAL  CHIEF COMPLAINT:  Chief Complaint  Patient presents with   New Patient (Initial Visit)    Pt in room 6. Alone. Internal referral for neuropathy. Pt reports feels electrical pain in right leg, pt had surgery on right foot in 2020 & 2023.     HISTORY OF PRESENT ILLNESS:   63 year old female here for evaluation of pain in right foot.  Patient had developed right bunion, underwent right bunionectomy in 2020.  Following this she developed increased pain in her right foot and toes.  Symptoms worsened over time.  She had another surgery in 2023, but unfortunately symptoms significant worsened following this.  Since that time patient has pain and numbness on the bottom of her toes on the right side.  Left foot is okay.  Interestingly patient did have surgeries on the left side in 2020 and 2023, but did not have problems in the left foot.  Patient denies any problems in her proximal legs, knees, hips or lower back.  No problems in fingers or hands.  Has tried some pain medication the past for other issues but is reluctant to take pain medicine going forward.  She is not interested in additional surgeries or procedures.   REVIEW OF SYSTEMS: Full 14 system review of systems performed and negative with exception of: as per HPI.  ALLERGIES: Allergies  Allergen Reactions   Fish Allergy Anaphylaxis   Tomato Anaphylaxis   Povidone Iodine     itching Other reaction(s): Unknown   Soap     Other reaction(s): Rash  [employer] Soaps   Latex Rash   Other Rash    HOME MEDICATIONS: Outpatient Medications Prior to Visit  Medication Sig Dispense Refill   cetirizine  (ZYRTEC ) 10 MG tablet Take 1 tablet (10 mg total) by mouth daily. 30 tablet 11   famotidine  (PEPCID ) 40 MG tablet Take 1 tablet (40 mg  total) by mouth daily. 30 tablet 0   fluticasone  (FLONASE ) 50 MCG/ACT nasal spray Place 2 sprays into both nostrils daily. 16 g 6   omeprazole  (PRILOSEC) 20 MG capsule Take 1 capsule (20 mg total) by mouth 2 (two) times daily. 60 capsule 0   valACYclovir (VALTREX) 500 MG tablet Take 500 mg by mouth 2 (two) times daily. (Patient taking differently: Take 500 mg by mouth 2 (two) times daily. As needed)     benzonatate  (TESSALON ) 100 MG capsule Take 1 capsule (100 mg total) by mouth 3 (three) times daily as needed. 30 capsule 0   chlorhexidine (PERIDEX) 0.12 % solution PLEASE SEE ATTACHED FOR DETAILED DIRECTIONS (Patient not taking: Reported on 07/14/2023)     lidocaine  (XYLOCAINE ) 2 % solution Use as directed 15 mLs in the mouth or throat as needed for mouth pain. 100 mL 0   venlafaxine  XR (EFFEXOR -XR) 75 MG 24 hr capsule Take 75 mg by mouth daily.  1   No facility-administered medications prior to visit.    PAST MEDICAL HISTORY: Past Medical History:  Diagnosis Date   Allergy    Chronic pain    History of depression    Thyroid  disease     PAST SURGICAL HISTORY: Past Surgical History:  Procedure Laterality Date   BUNIONECTOMY Right    MYOMECTOMY      FAMILY HISTORY: Family History  Problem Relation Age of Onset   CAD  Mother    CAD Father    Thyroid  disease Neg Hx     SOCIAL HISTORY: Social History   Socioeconomic History   Marital status: Married    Spouse name: Not on file   Number of children: Not on file   Years of education: Not on file   Highest education level: Not on file  Occupational History   Not on file  Tobacco Use   Smoking status: Never    Passive exposure: Never   Smokeless tobacco: Never  Vaping Use   Vaping status: Never Used  Substance and Sexual Activity   Alcohol use: Yes    Comment: Occasionally.   Drug use: Never   Sexual activity: Not on file  Other Topics Concern   Not on file  Social History Narrative   Not on file   Social Drivers  of Health   Financial Resource Strain: Not on file  Food Insecurity: No Food Insecurity (07/02/2022)   Hunger Vital Sign    Worried About Running Out of Food in the Last Year: Never true    Ran Out of Food in the Last Year: Never true  Transportation Needs: No Transportation Needs (07/02/2022)   PRAPARE - Administrator, Civil Service (Medical): No    Lack of Transportation (Non-Medical): No  Physical Activity: Not on file  Stress: Not on file  Social Connections: Unknown (07/09/2021)   Received from [facility]   Social Network    Social Network: Not on file  Intimate Partner Violence: Not At Risk (07/02/2022)   Humiliation, Afraid, Rape, and Kick questionnaire    Fear of Current or Ex-Partner: No    Emotionally Abused: No    Physically Abused: No    Sexually Abused: No     PHYSICAL EXAM  GENERAL EXAM/CONSTITUTIONAL: Vitals:  Vitals:   07/14/23 0828  BP: (!) 108/59  Pulse: 80  SpO2: 97%  Weight: 154 lb 6.4 oz (70 kg)  Height: 5' 0.5 (1.537 m)   Body mass index is 29.66 kg/m. Wt Readings from Last 3 Encounters:  07/14/23 154 lb 6.4 oz (70 kg)  03/12/21 165 lb 3.2 oz (74.9 kg)  04/08/19 188 lb 3.2 oz (85.4 kg)   Patient is in no distress; well developed, nourished and groomed; neck is supple  CARDIOVASCULAR: Examination of carotid arteries is normal; no carotid bruits Regular rate and rhythm, no murmurs Examination of peripheral vascular system by observation and palpation is normal  EYES: Ophthalmoscopic exam of optic discs and posterior segments is normal; no papilledema or hemorrhages No results found.  MUSCULOSKELETAL: Gait, strength, tone, movements noted in Neurologic exam below  NEUROLOGIC: MENTAL STATUS:      No data to display         awake, alert, oriented to person, place and time recent and remote memory intact normal attention and concentration language fluent, comprehension intact, naming intact fund of knowledge  appropriate  CRANIAL NERVE:  2nd - no papilledema on fundoscopic exam 2nd, 3rd, 4th, 6th - pupils equal and reactive to light, visual fields full to confrontation, extraocular muscles intact, no nystagmus 5th - facial sensation symmetric 7th - facial strength symmetric 8th - hearing intact 9th - palate elevates symmetrically, uvula midline 11th - shoulder shrug symmetric 12th - tongue protrusion midline  MOTOR:  normal bulk and tone, full strength in the BUE, BLE  SENSORY:  normal and symmetric to light touch, temperature, vibration; EXCEPT DECR PP IN BILATERAL TOES  COORDINATION:  finger-nose-finger,  fine finger movements normal  REFLEXES:  deep tendon reflexes TRACE and symmetric  GAIT/STATION:  narrow based gait     DIAGNOSTIC DATA (LABS, IMAGING, TESTING) - I reviewed patient records, labs, notes, testing and imaging myself where available.  Lab Results  Component Value Date   WBC 3.2 (L) 02/22/2023   HGB 14.4 02/22/2023   HCT 42.5 02/22/2023   MCV 91.2 02/22/2023   PLT 255 02/22/2023      Component Value Date/Time   NA 140 02/22/2023 1111   NA 138 04/08/2019 1549   K 4.1 02/22/2023 1111   CL 106 02/22/2023 1111   CO2 27 02/22/2023 1111   GLUCOSE 89 02/22/2023 1111   BUN 13 02/22/2023 1111   BUN 15 04/08/2019 1549   CREATININE 0.88 02/22/2023 1111   CALCIUM 9.5 02/22/2023 1111   PROT 6.8 04/08/2019 1549   ALBUMIN 4.4 04/08/2019 1549   AST 20 04/08/2019 1549   ALT 13 04/08/2019 1549   ALKPHOS 102 04/08/2019 1549   BILITOT 0.6 04/08/2019 1549   GFRNONAA >60 02/22/2023 1111   GFRAA 65 04/08/2019 1549   No results found for: CHOL, HDL, LDLCALC, LDLDIRECT, TRIG, CHOLHDL No results found for: YHAJ8R No results found for: VITAMINB12 Lab Results  Component Value Date   TSH 2.190 04/08/2019      ASSESSMENT AND PLAN  63 y.o. year old female here with:   Dx:  1. Pain in both feet      PLAN:  RIGHT FOOT PAIN (since surgery  in 2020, worsened in 2023; could be post-operative complication vs underlying polyneuropathy; no evidence of lumbar radiculopathy on exam)  - CHECK neuropathy labs (to rule out other causes)  - Consider painful neuropathy treatment options: -duloxetine 30-60mg  daily, amitriptyline 25-50mg  at bedtime, gabapentin 100-300mg  three times a day, pregabalin 75-150mg  twice a day -capsaicin cream, lidocaine  patch / cream, alpha-lipoic acid 600mg  daily  Orders Placed This Encounter  Procedures   CBC with diff   CMP   Vitamin B12   A1c   TSH   SPEP with IFE   ANA w/Reflex   SSA, SSB   Vitamin B6   Vitamin B1   Return for return to referring provider, pending if symptoms worsen or fail to improve.    EDUARD FABIENE HANLON, MD 07/14/2023, 9:38 AM Certified in Neurology, Neurophysiology and Neuroimaging  [facility] [address] [place], [place] [address] [phone]

## 2023-07-18 ENCOUNTER — Ambulatory Visit: Admitting: Podiatry

## 2023-07-18 ENCOUNTER — Encounter: Payer: Self-pay | Admitting: Podiatry

## 2023-07-18 VITALS — Ht 62.0 in | Wt 154.5 lb

## 2023-07-18 DIAGNOSIS — G5761 Lesion of plantar nerve, right lower limb: Secondary | ICD-10-CM

## 2023-07-18 DIAGNOSIS — G5791 Unspecified mononeuropathy of right lower limb: Secondary | ICD-10-CM

## 2023-07-18 DIAGNOSIS — M7741 Metatarsalgia, right foot: Secondary | ICD-10-CM | POA: Diagnosis not present

## 2023-07-18 DIAGNOSIS — M778 Other enthesopathies, not elsewhere classified: Secondary | ICD-10-CM

## 2023-07-18 NOTE — Progress Notes (Signed)
 Subjective:  Patient ID: Katrina Marquez, female    DOB: Nov 30, 1960,  MRN: [id number]  Chief Complaint  Patient presents with   Numbness    Patient states she has no feeling in toes after surgery especially after working out    Discussed the use of AI scribe software for clinical note transcription with the patient, who gave verbal consent to proceed.  History of Present Illness Katrina Marquez is a 63 year old female who presents with right foot discomfort and numbness in her toes.  She experiences ongoing discomfort and numbness in her right foot, particularly in the toes, with sensations of 'pins and needles' and a feeling of walking on 'half of a tennis ball.' Burning sensations and a perceived lack of blood flow to her toes are also noted. These symptoms have persisted since her last surgery in November 2023.  Her surgical history includes multiple procedures on both feet. In 2020, she underwent an Massie bunionectomy and tailor's bunionectomy on her left foot, followed by a right foot surgery in December 2020 for a bunion and fibroma excision. A second surgery on her right foot in November 2023 included a second metatarsal shortening osteotomy and fibroma excision.  She did notice change in sensation between the 1st and 2nd toe not long after surgery but did not have any major complications.  She has had multiple consultations and treatments, including injections that temporarily alleviated pain. An MRI in May 2023 showed a fibroma on the medial band of the plantar fascia and mild osteoarthritis of the first MTP joint.  January 2025 MRI reports describe bone marrow edema of the medial hallux sesamoid.  She has undergone an EMG and NCV, and recent lab work including CBC, CMP, Vitamin B12, A1c, TSH, SPAP, and ANA; the B1 and B6 results are still pending. No family history of nerve issues or pain in the feet, and she is not diabetic or a smoker.  She reports difficulty walking and running,  and she has tried various footwear to alleviate discomfort. Walking barefoot is particularly uncomfortable unless on a soft surface. She has been using gel insoles in her sneakers for additional support. No low back pain. Pain occurs when walking, but not when pressing on the foot. Discomfort and altered sensation are present in the plantar digital sulcus, particularly between the second and third toes. She reports 'sparks' or electrical sensations up the leg, but no shooting pain into the toes. No pain at night or when not on her feet.      Objective:    Physical Exam VASCULAR: DP and PT pulse palpable. Foot is warm and well-perfused. Capillary fill time is brisk. DERMATOLOGIC: Normal skin turgor, texture, and temperature. No open lesions, rashes, or ulcerations. NEUROLOGIC: Normal sensation to light touch and pressure. Vibratory sensation intact. Discomfort and altered sensation in plantar digital sulcus, second enter space between second and third toes.  She does have some tenderness to percussion of the tarsal tunnel but there is no positive Tinel sign.  Straight leg raise test is negative ORTHOPEDIC: Smooth pain-free range of motion of all examined joints. Mild tenderness plantar left and right midarch in the areas of the fibroma. No ecchymosis or bruising. No gross deformity.    No images are attached to the encounter.    Results LABS CBC: No abnormalities (06/2023) CMP: No abnormalities (06/2023) Vitamin B12: No abnormalities (06/2023) A1c: No abnormalities (06/2023) TSH: No abnormalities (06/2023) SPAP: No abnormalities (06/2023) ANA: No abnormalities (06/2023)  RADIOLOGY MRI Right Foot (  05/2021): Bone marrow edema of the medial hallux sesamoid MRI Right Foot (11/2021): Fibroma measuring 8 millimeter by 6 millimeter on the medial end of the plantar fascia; mild osteoarthritis of the first MTP joint MRI Right Foot (01/2023): Bone marrow edema in the tibial sesamoid; significant  magnetic artifact in the area of the second, third, and fourth metatarsal phalangeal joints; decreased plantar fibroma   Assessment:   1. Neuritis of right foot   2. Metatarsalgia of right foot   3. Capsulitis of foot   4. 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  Assessment and Plan Assessment & Plan Peripheral neuritis Chronic discomfort in the right foot with numbness and tingling in the second interspace between the second and third toes, suggesting nerve-related issues possibly due to periarticular inflammation or a neuroma. Previous surgeries and presence of screws may contribute although the time course is surprising. Differential includes idiopathic neuropathy and nerve entrapment in scar tissue, possible peripheral Morton's neuroma. Neurologist's evaluation and lab work mostly unremarkable, pending B1 and B6 results. Sensation tests indicate no major nerve damage, but discomfort persists. - Dispense prefabricated orthosis with metatarsal pad to offload pressure. - If no improvement in 2-3 weeks, order musculoskeletal ultrasound of the area. - Will discuss with neurologist to discuss potential value of EMG or NCV. - Reevaluate in one month.     A total of 30 minutes was spent today on the review of the patients medical record including previous laboratory values, imaging studies, taking of the history, examining the patient, the ordering of procedures/labs/tests/medications, coordination of care, and documentation in the chart.    No follow-ups on file.

## 2023-07-19 ENCOUNTER — Telehealth: Payer: Self-pay | Admitting: Podiatry

## 2023-07-19 LAB — MULTIPLE MYELOMA PANEL, SERUM
Albumin SerPl Elph-Mcnc: 4.4 g/dL (ref 2.9–4.4)
Albumin/Glob SerPl: 1.6 (ref 0.7–1.7)
Alpha 1: 0.2 g/dL (ref 0.0–0.4)
Alpha2 Glob SerPl Elph-Mcnc: 0.7 g/dL (ref 0.4–1.0)
B-Globulin SerPl Elph-Mcnc: 1 g/dL (ref 0.7–1.3)
Gamma Glob SerPl Elph-Mcnc: 0.9 g/dL (ref 0.4–1.8)
Globulin, Total: 2.8 g/dL (ref 2.2–3.9)
IgA/Immunoglobulin A, Serum: 154 mg/dL (ref 87–352)
IgG (Immunoglobin G), Serum: 983 mg/dL (ref 586–1602)
IgM (Immunoglobulin M), Srm: 54 mg/dL (ref 26–217)

## 2023-07-19 LAB — CBC WITH DIFFERENTIAL/PLATELET
Basophils Absolute: 0 10*3/uL (ref 0.0–0.2)
Basos: 1 %
EOS (ABSOLUTE): 0.1 10*3/uL (ref 0.0–0.4)
Eos: 2 %
Hematocrit: 42.1 % (ref 34.0–46.6)
Hemoglobin: 13.4 g/dL (ref 11.1–15.9)
Immature Grans (Abs): 0 10*3/uL (ref 0.0–0.1)
Immature Granulocytes: 0 %
Lymphocytes Absolute: 1.6 10*3/uL (ref 0.7–3.1)
Lymphs: 51 %
MCH: 31.5 pg (ref 26.6–33.0)
MCHC: 31.8 g/dL (ref 31.5–35.7)
MCV: 99 fL — ABNORMAL HIGH (ref 79–97)
Monocytes Absolute: 0.2 10*3/uL (ref 0.1–0.9)
Monocytes: 7 %
Neutrophils Absolute: 1.2 10*3/uL — ABNORMAL LOW (ref 1.4–7.0)
Neutrophils: 39 %
Platelets: 158 10*3/uL (ref 150–450)
RBC: 4.26 x10E6/uL (ref 3.77–5.28)
RDW: 12.3 % (ref 11.7–15.4)
WBC: 3.2 10*3/uL — ABNORMAL LOW (ref 3.4–10.8)

## 2023-07-19 LAB — COMPREHENSIVE METABOLIC PANEL WITH GFR
ALT: 14 IU/L (ref 0–32)
AST: 15 IU/L (ref 0–40)
Albumin: 4.7 g/dL (ref 3.9–4.9)
Alkaline Phosphatase: 96 IU/L (ref 44–121)
BUN/Creatinine Ratio: 16 (ref 12–28)
BUN: 15 mg/dL (ref 8–27)
Bilirubin Total: 1 mg/dL (ref 0.0–1.2)
CO2: 22 mmol/L (ref 20–29)
Calcium: 9.9 mg/dL (ref 8.7–10.3)
Chloride: 103 mmol/L (ref 96–106)
Creatinine, Ser: 0.96 mg/dL (ref 0.57–1.00)
Globulin, Total: 2.5 g/dL (ref 1.5–4.5)
Glucose: 84 mg/dL (ref 70–99)
Potassium: 4.4 mmol/L (ref 3.5–5.2)
Sodium: 142 mmol/L (ref 134–144)
Total Protein: 7.2 g/dL (ref 6.0–8.5)
eGFR: 66 mL/min/{1.73_m2} (ref >59)

## 2023-07-19 LAB — VITAMIN B1: Thiamine: 115.7 nmol/L (ref 66.5–200.0)

## 2023-07-19 LAB — HEMOGLOBIN A1C
Est. average glucose Bld gHb Est-mCnc: 105 mg/dL
Hgb A1c MFr Bld: 5.3 % (ref 4.8–5.6)

## 2023-07-19 LAB — SJOGREN'S SYNDROME ANTIBODS(SSA + SSB)
ENA SSA (RO) Ab: 0.2 AI (ref 0.0–0.9)
ENA SSB (LA) Ab: <0.2 AI (ref 0.0–0.9)

## 2023-07-19 LAB — VITAMIN B12: Vitamin B-12: 757 pg/mL (ref 232–1245)

## 2023-07-19 LAB — VITAMIN B6: Vitamin B6: 56.3 ug/L (ref 3.4–65.2)

## 2023-07-19 LAB — TSH: TSH: 3.28 u[IU]/mL (ref 0.450–4.500)

## 2023-07-19 LAB — ANA W/REFLEX

## 2023-07-20 ENCOUNTER — Ambulatory Visit: Payer: Self-pay | Admitting: Neurology

## 2023-08-08 ENCOUNTER — Telehealth: Payer: Self-pay | Admitting: Podiatry

## 2023-08-08 NOTE — Telephone Encounter (Signed)
 Patient called today stating they were still having problems with feet, constant pain, difficulty sleeping, and shoe inserts rolling.  Patients has emailed me a typed letter with issues she is experiencing. Information has been shared with Dr. Silva.

## 2023-08-29 ENCOUNTER — Ambulatory Visit: Admitting: Podiatry

## 2023-08-29 DIAGNOSIS — M778 Other enthesopathies, not elsewhere classified: Secondary | ICD-10-CM | POA: Diagnosis not present

## 2023-08-29 DIAGNOSIS — G5791 Unspecified mononeuropathy of right lower limb: Secondary | ICD-10-CM

## 2023-08-29 DIAGNOSIS — M7741 Metatarsalgia, right foot: Secondary | ICD-10-CM

## 2023-08-29 DIAGNOSIS — G5761 Lesion of plantar nerve, right lower limb: Secondary | ICD-10-CM

## 2023-08-29 NOTE — Progress Notes (Signed)
 Subjective:  Patient ID: Katrina Marquez, female    DOB: 1960-10-07,  MRN: [id number]  Chief Complaint  Patient presents with   Foot Problem    RM 3 Neuritis of right foot.  Patient states numbing sensation post surgery,     Discussed the use of AI scribe software for clinical note transcription with the patient, who gave verbal consent to proceed.  History of Present Illness Katrina Marquez is a 63 year old female who presents with right foot discomfort and numbness in her toes.  She experiences ongoing discomfort and numbness in her right foot, particularly in the toes, with sensations of 'pins and needles' and a feeling of walking on 'half of a tennis ball.' Burning sensations and a perceived lack of blood flow to her toes are also noted. These symptoms have persisted since her last surgery in November 2023.  Her surgical history includes multiple procedures on both feet. In 2020, she underwent an Massie bunionectomy and tailor's bunionectomy on her left foot, followed by a right foot surgery in December 2020 for a bunion and fibroma excision. A second surgery on her right foot in November 2023 included a second metatarsal shortening osteotomy and fibroma excision.  She did notice change in sensation between the 1st and 2nd toe not long after surgery but did not have any major complications.  She has had multiple consultations and treatments, including injections that temporarily alleviated pain. An MRI in May 2023 showed a fibroma on the medial band of the plantar fascia and mild osteoarthritis of the first MTP joint.  January 2025 MRI reports describe bone marrow edema of the medial hallux sesamoid.  She has undergone an EMG and NCV, and recent lab work including CBC, CMP, Vitamin B12, A1c, TSH, SPAP, and ANA; the B1 and B6 results are still pending. No family history of nerve issues or pain in the feet, and she is not diabetic or a smoker.  She reports difficulty walking and running, and  she has tried various footwear to alleviate discomfort. Walking barefoot is particularly uncomfortable unless on a soft surface. She has been using gel insoles in her sneakers for additional support. No low back pain. Pain occurs when walking, but not when pressing on the foot. Discomfort and altered sensation are present in the plantar digital sulcus, particularly between the second and third toes. She reports 'sparks' or electrical sensations up the leg, but no shooting pain into the toes. No pain at night or when not on her feet.    Interval history: She returns for follow-up today still frustrated with her progress and has not improved.  The insoles and pad have not helped. She has not worn them much because they did not help.   Objective:    Physical Exam VASCULAR: DP and PT pulse palpable. Foot is warm and well-perfused. Capillary fill time is brisk. DERMATOLOGIC: Normal skin turgor, texture, and temperature. No open lesions, rashes, or ulcerations. NEUROLOGIC: Normal sensation to light touch and pressure globally. Vibratory sensation intact.  There is some discomfort and altered sensation in plantar digital sulcus, 1st and 2nd interspace plantar.   ORTHOPEDIC: Smooth pain-free range of motion of all examined joints.  No pain on the sesamoid complex plantar first MTP joint right foot. No ecchymosis or bruising. No gross deformity.    No images are attached to the encounter.    Results LABS CBC: No abnormalities (06/2023) CMP: No abnormalities (06/2023) Vitamin B12: No abnormalities (06/2023) A1c: No abnormalities (06/2023) TSH: No abnormalities (  06/2023) SPAP: No abnormalities (06/2023) ANA: No abnormalities (06/2023)  RADIOLOGY MRI Right Foot (05/2021): Bone marrow edema of the medial hallux sesamoid MRI Right Foot (11/2021): Fibroma measuring 8 millimeter by 6 millimeter on the medial end of the plantar fascia; mild osteoarthritis of the first MTP joint MRI Right Foot  (01/2023): Bone marrow edema in the tibial sesamoid; significant magnetic artifact in the area of the second, third, and fourth metatarsal phalangeal joints; decreased plantar fibroma   Assessment:   No diagnosis found.    Plan:  Patient was evaluated and treated and all questions answered.  Assessment and Plan Assessment & Plan Peripheral neuritis Her chronic pain has not improved.  She is understandably frustrated with her progression.  She saw neurology which ordered lab work and none of this was revealing for any polyneuropathy.  I discussed the option of an EMG/NCV with her neurologist Dr. Margaret, he did not feel that this would be of any clinical value of this far distal into the foot to give us  any further information on possible neuritis or nerve entrapment.  The MRI was unrevealing for the possibility of a neuroma due to metallic artifact which we discussed.  I discussed with her that there is an option for ultrasound examination and I am happy to send a referral for this to evaluate for this possibility, options beyond that would include injection therapy targeted at the neuroma if there is 1 as well as neurectomy but this would leave with permanent numbness.  Much of this numbness may not improve.  If this is unrevealing then her only option may be chronic pain management.  She also met with our practice administrator today to discuss the above issues and her ongoing care.  We also discussed her case as a group at a intradepartmental case conference and reviewed the summary of care and treatment options.  It was largely agreed upon that an ultrasound is reasonable to evaluate further and may guide further treatment options including targeted injection if there is a neuroma, if she does not want to proceed with this or if this is unrevealing then chronic pain management with interventional pain management as well as supportive shoe gear and insoles is likely her best  option.       No follow-ups on file.

## 2023-09-01 ENCOUNTER — Encounter: Payer: Self-pay | Admitting: Podiatry

## 2023-09-04 ENCOUNTER — Telehealth: Payer: Self-pay | Admitting: Podiatry

## 2023-09-04 NOTE — Telephone Encounter (Signed)
 Spoke to pt this morning to inform her that the providers recently met and reviewed her case. The recommendation is for pt to have an ultrasound or to be seen by pain management.  Patient still has questions and is not happy.  I offered to give her number to grievance and she requested the grievance number [phone]) be emailed to her instead.

## 2023-10-25 DIAGNOSIS — K080 Exfoliation of teeth due to systemic causes: Secondary | ICD-10-CM | POA: Diagnosis not present

## 2023-11-19 IMAGING — MR MR HEAD WO/W CM
14 series · 48 of 48 positions shown · IV contrast (multihance)
Comparison: 05/16/2019

CLINICAL DATA: Hemangioblastoma follow-up. Tremors and difficulty
speaking

EXAM:
MRI HEAD WITHOUT AND WITH CONTRAST
TECHNIQUE: Multiplanar, multiecho pulse sequences of the brain and surrounding
structures were obtained without and with intravenous contrast.
CONTRAST:  15mL MULTIHANCE GADOBENATE DIMEGLUMINE 529 MG/ML IV SOLN

[Series 5: T1 · sagittal · 4.0mm · 0.75mm/px · 1 of 31 slices shown (1 of 3)]
[im 1/31]
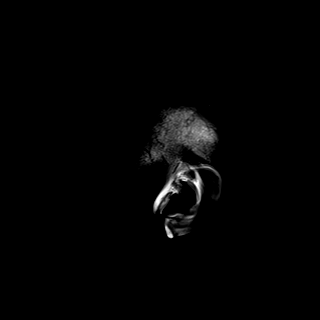

[Series 6: DWI · axial · 3.0mm · 0.94mm/px · z∈[-76,+59]mm · 7 of 160 slices shown (1 of 3)]
[im 1/160]
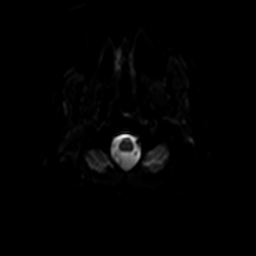
[im 27/160]
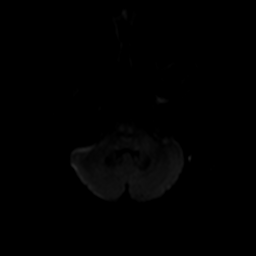
[im 54/160]
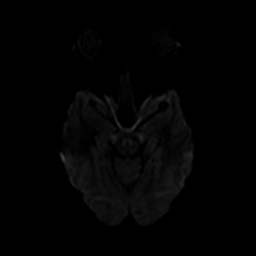
[im 80/160]
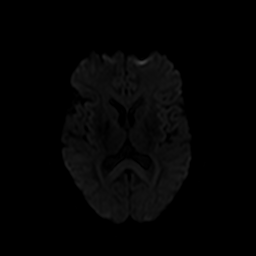
[im 107/160]
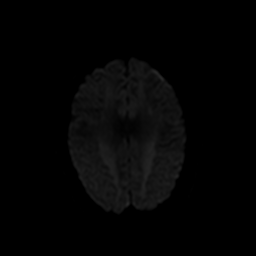
[im 133/160]
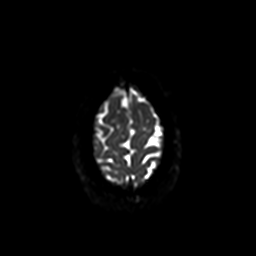
[im 160/160]
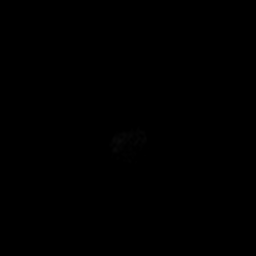

[Series 7: ax dwi_tracew · axial · 3.0mm · 0.94mm/px · z∈[-76,+59]mm · 4 of 80 slices shown]
[im 1/80]
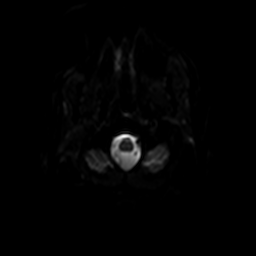
[im 27/80]
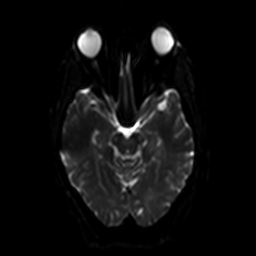
[im 53/80]
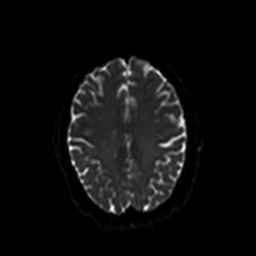
[im 80/80]
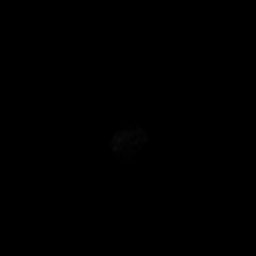

[Series 8: ax dwi_adc · axial · 3.0mm · 0.94mm/px · z∈[-76,+59]mm · 2 of 40 slices shown]
[im 1/40]
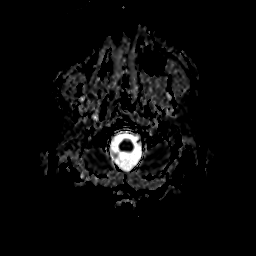
[im 40/40]
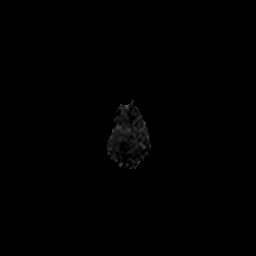

[Series 9: DWI · coronal · 5.0mm · 1.44mm/px · 3 of 66 slices shown (2 of 3)]
[im 1/66]
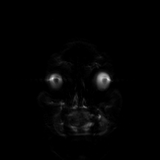
[im 33/66]
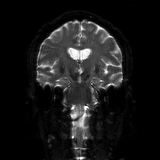
[im 66/66]
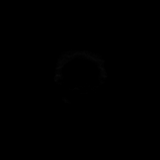

[Series 10: DWI · coronal · 5.0mm · 1.44mm/px · 2 of 33 slices shown (3 of 3)]
[im 1/33]
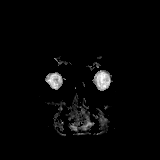
[im 33/33]
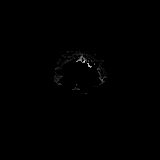

[Series 11: T2 · axial · 4.0mm · 0.36mm/px · 1 of 30 slices shown]
[im 1/30]
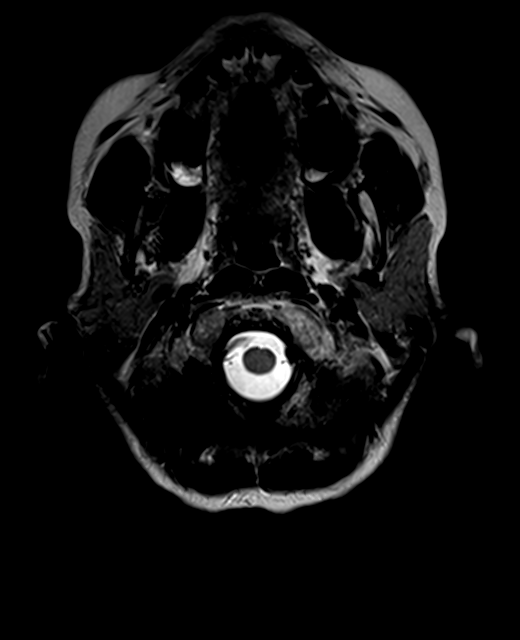

[Series 12: FLAIR · axial · 3.0mm · 0.72mm/px · 1 of 26 slices shown]
[im 1/26]
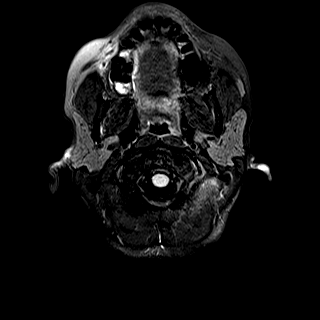

[Series 14: swi_images · axial · 1.5mm · 0.90mm/px · z∈[-72,+65]mm · 5 of 96 slices shown]
[im 1/96]
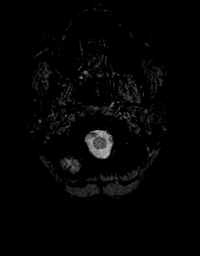
[im 24/96]
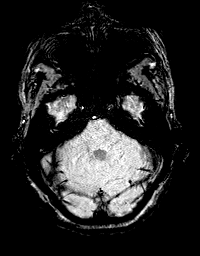
[im 48/96]
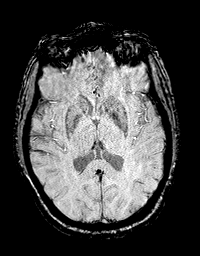
[im 72/96]
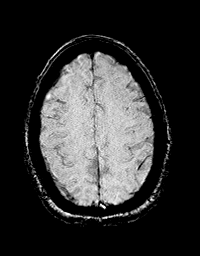
[im 96/96]
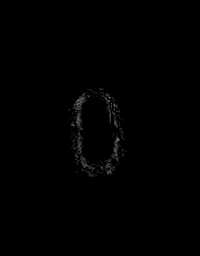

[Series 15: T1 · axial · 1.0mm · 0.94mm/px · z∈[-81,+71]mm · 8 of 160 slices shown (2 of 3)]
[im 1/160]
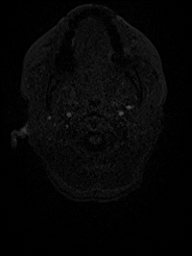
[im 23/160]
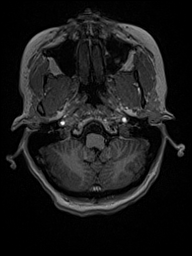
[im 46/160]
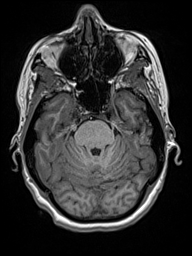
[im 69/160]
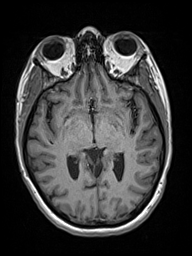
[im 91/160]
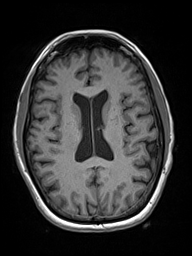
[im 114/160]
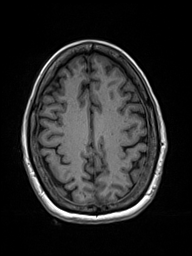
[im 137/160]
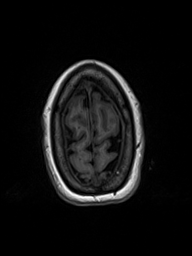
[im 160/160]
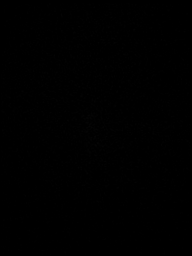

[Series 21: T2 post-contrast · coronal · 4.0mm · 0.36mm/px · 2 of 34 slices shown]
[im 1/34]
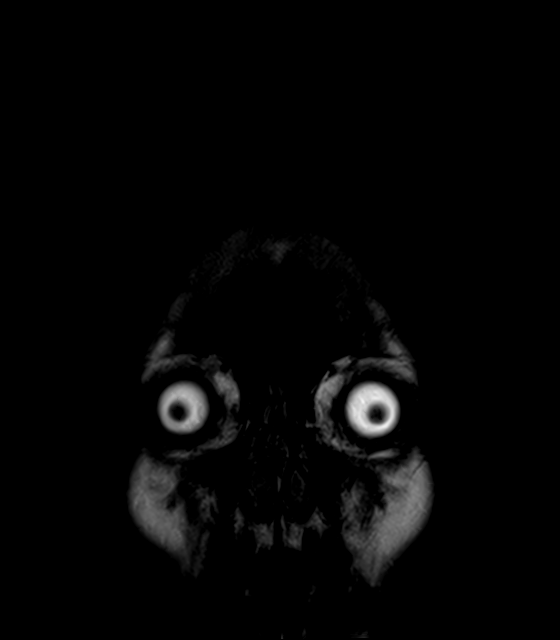
[im 34/34]
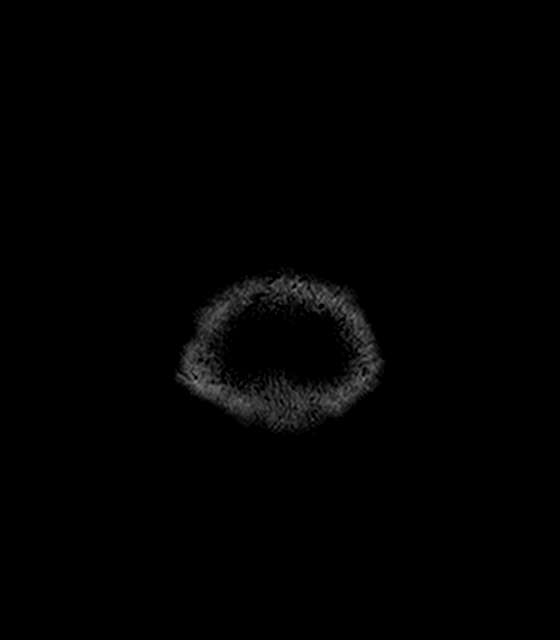

[Series 22: T1 · axial · 1.0mm · 0.94mm/px · z∈[-147,+10]mm · 8 of 160 slices shown (3 of 3)]
[im 1/160]
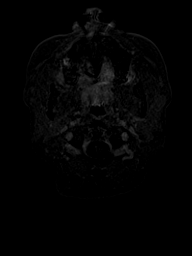
[im 23/160]
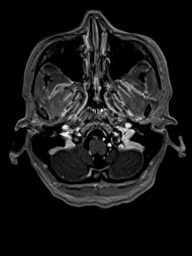
[im 46/160]
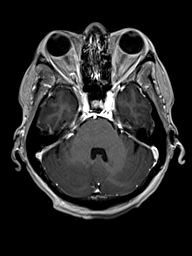
[im 69/160]
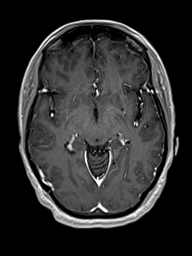
[im 91/160]
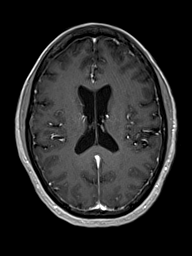
[im 114/160]
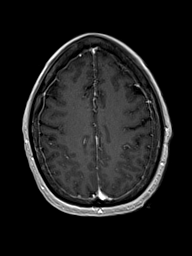
[im 137/160]
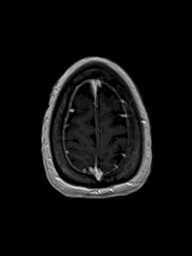
[im 160/160]
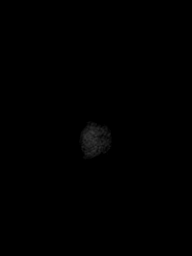

[Series 23: T1 post-contrast · coronal · 4.0mm · 0.72mm/px · 2 of 34 slices shown (1 of 2)]
[im 1/34]
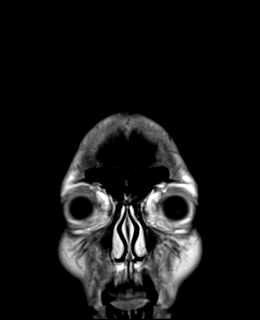
[im 34/34]
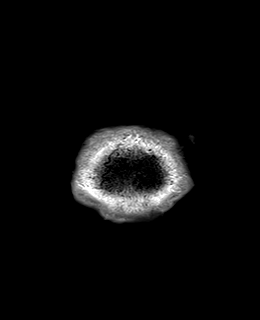

[Series 24: T1 post-contrast · sagittal · 4.0mm · 0.75mm/px · 2 of 31 slices shown (2 of 2)]
[im 1/31]
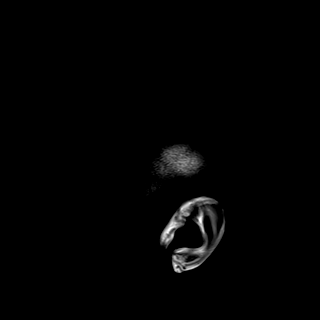
[im 31/31]
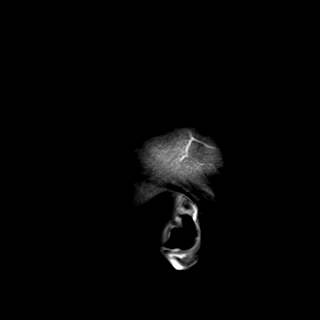

[48 of 48 positions shown; findings below may reference images not displayed]

FINDINGS: Brain: No acute infarct, mass effect or extra-axial collection.
Unchanged appearance of hyperintense T2-weighted signal in the
posterior fossa. There is multifocal hyperintense T2-weighted signal
within the white matter. Generalized cerebral volume loss. Contrast
enhancing nodule at the posterosuperior aspect of the treatment site
measures 9 x 7 mm, unchanged, but appears slightly thickened.
However, this is probably due to slice selection, as compared to
04/03/2018, it is unchanged there is no new site of abnormal
contrast enhancement.

Vascular: Major flow voids are preserved.

Skull and upper cervical spine: Normal calvarium and skull base.
Visualized upper cervical spine and soft tissues are normal.

Sinuses/Orbits:No paranasal sinus fluid levels or advanced mucosal
thickening. No mastoid or middle ear effusion. Normal orbits.

Other: Cystic area anterior to the right temporomandibular joint has
decreased in size.
IMPRESSION: 1. Unchanged enhancing nodule of the posterior right cerebellum.
2. No new site of abnormal contrast enhancement.
3. Decreased size of cystic focus anterior to the right
temporomandibular joint, possibly a synovial cyst.

## 2023-11-21 DIAGNOSIS — K080 Exfoliation of teeth due to systemic causes: Secondary | ICD-10-CM | POA: Diagnosis not present

## 2023-11-22 DIAGNOSIS — K080 Exfoliation of teeth due to systemic causes: Secondary | ICD-10-CM | POA: Diagnosis not present

## 2023-11-28 IMAGING — CT CT MAXILLOFACIAL W/ CM
3 of 5 series · 15 of 47 positions shown, 18 images · IV contrast (agent unspecified)
Comparison: None Available.

CLINICAL DATA: Lesion of mandible

EXAM:
CT MAXILLOFACIAL WITH CONTRAST
TECHNIQUE: Multidetector CT imaging of the maxillofacial structures was
performed with intravenous contrast. Multiplanar CT image
reconstructions were also generated.

[Series 4: maxillofacial with 2.00 hr40 s3 axial soft · axial · 0.37mm/px · z∈[-818,-662]mm · 11 of 92 slices shown, 14 images]
[im 7/92  brain]
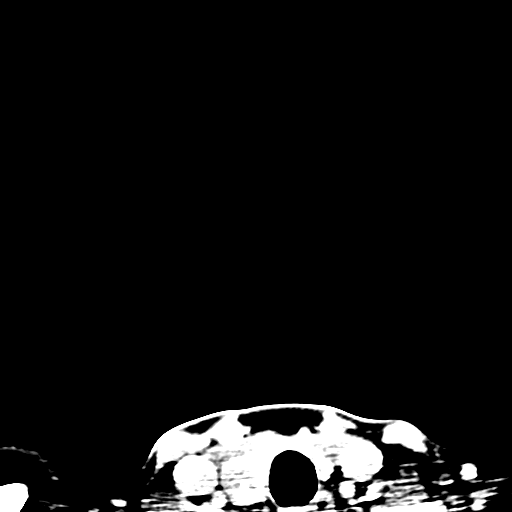
[im 7/92  bone]
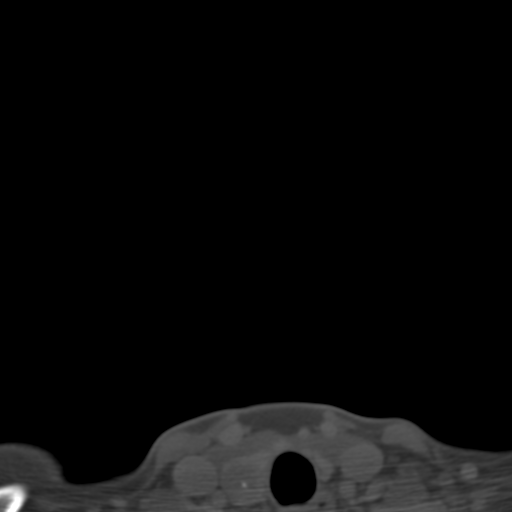
[im 13/92  bone]
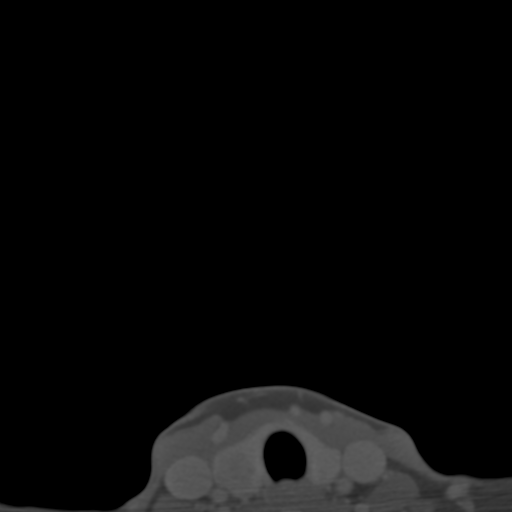
[im 22/92  bone]
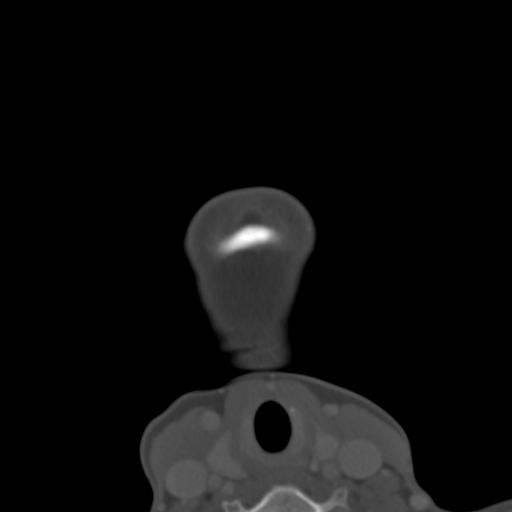
[im 29/92  bone]
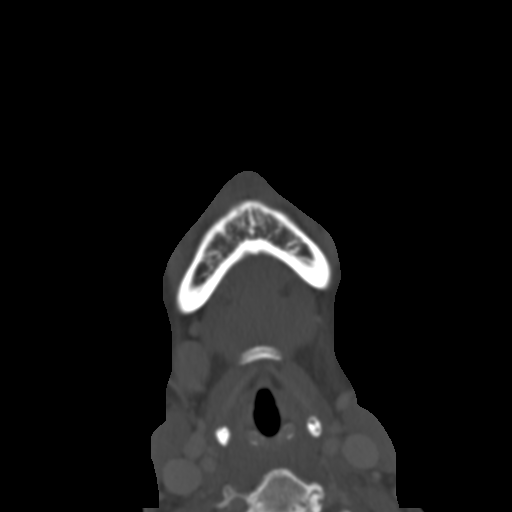
[im 38/92  brain]
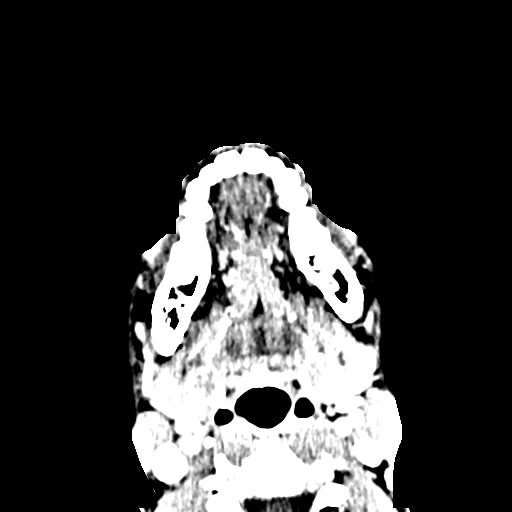
[im 38/92  bone]
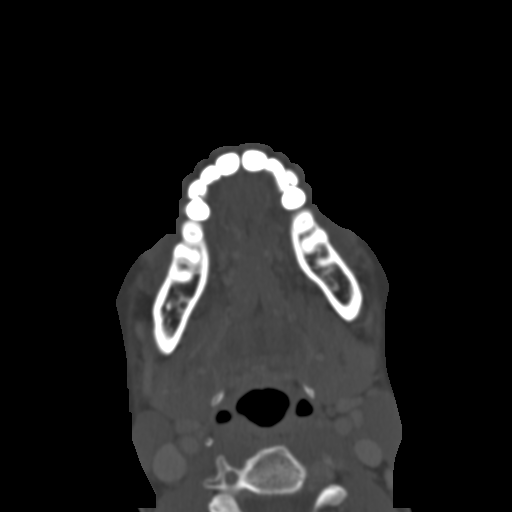
[im 48/92  bone]
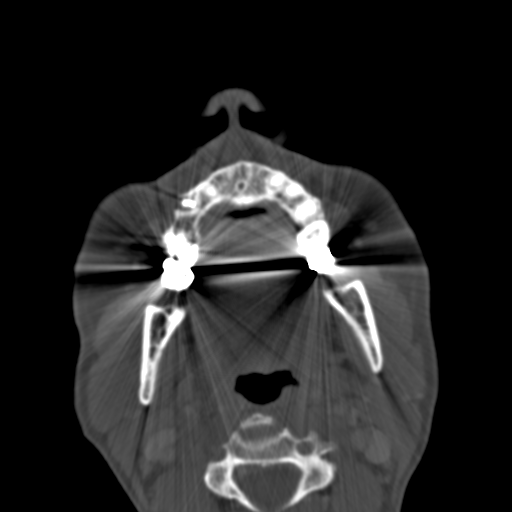
[im 54/92  bone]
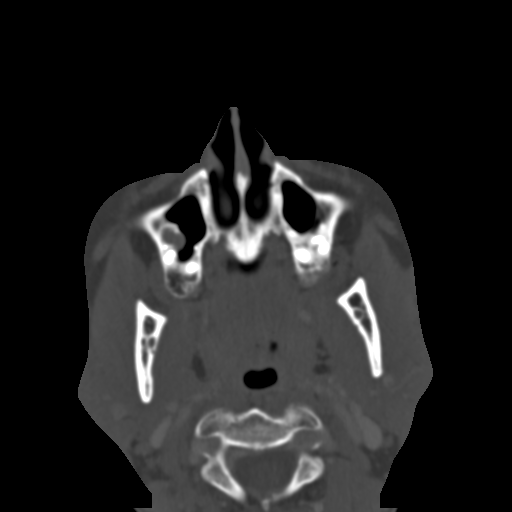
[im 63/92  bone]
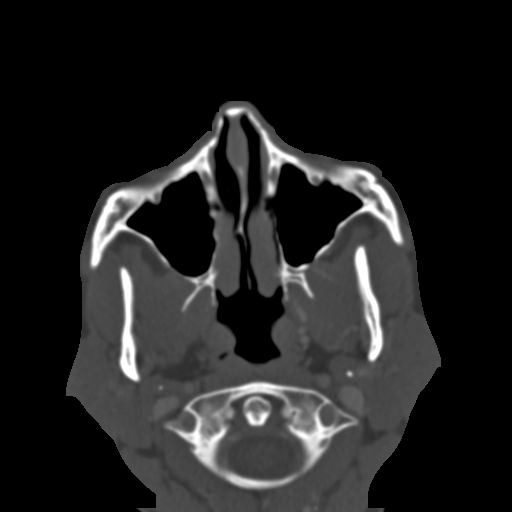
[im 70/92  brain]
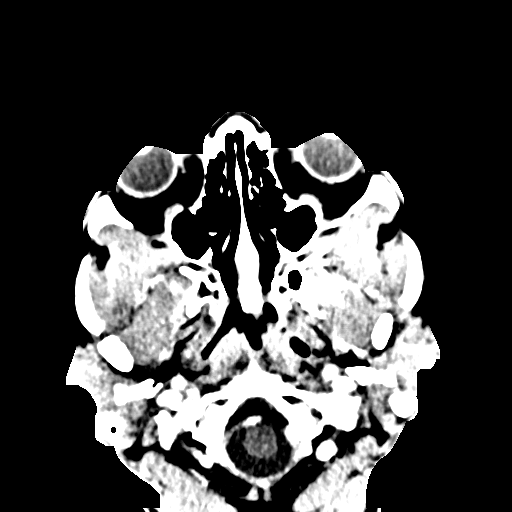
[im 70/92  bone]
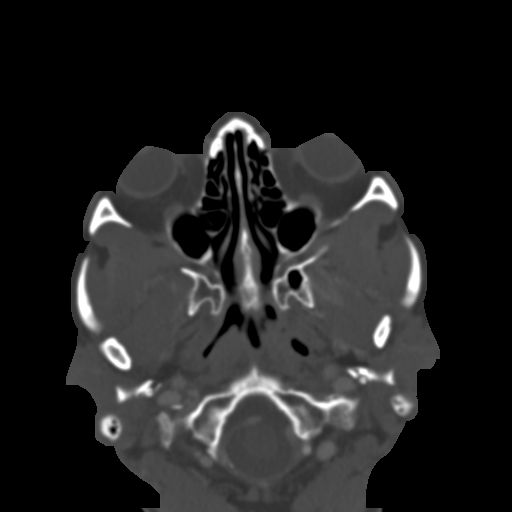
[im 79/92  bone]
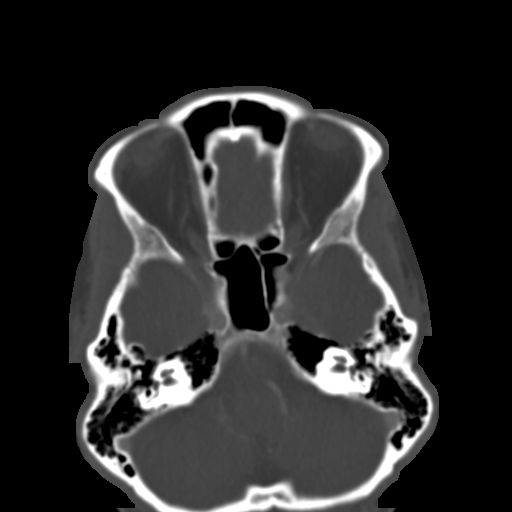
[im 85/92  bone]
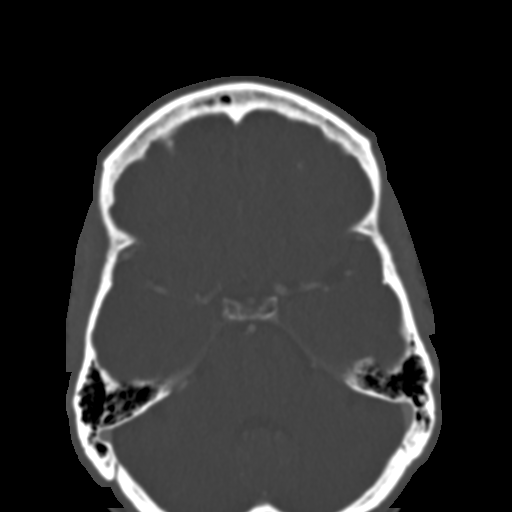

[Series 7: maxillofacial with 2.00 hr60 s3 cor bone · coronal · 0.38mm/px · 3 of 80 slices shown]
[im 20/80  bone]
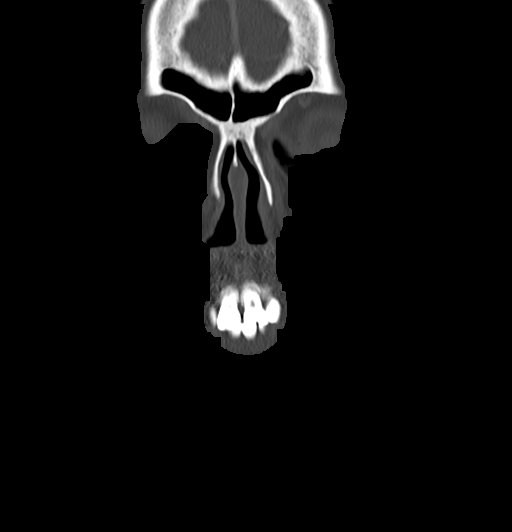
[im 40/80  bone]
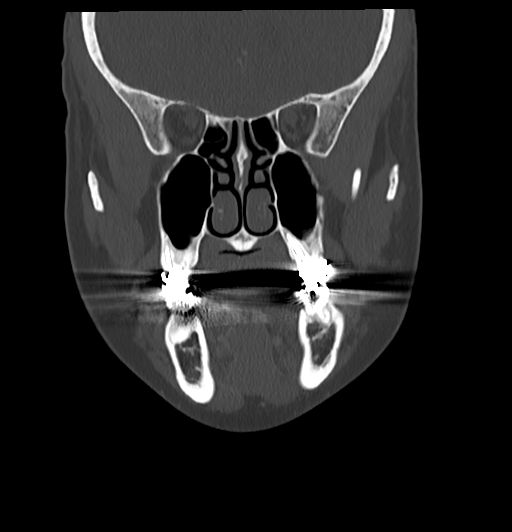
[im 60/80  bone]
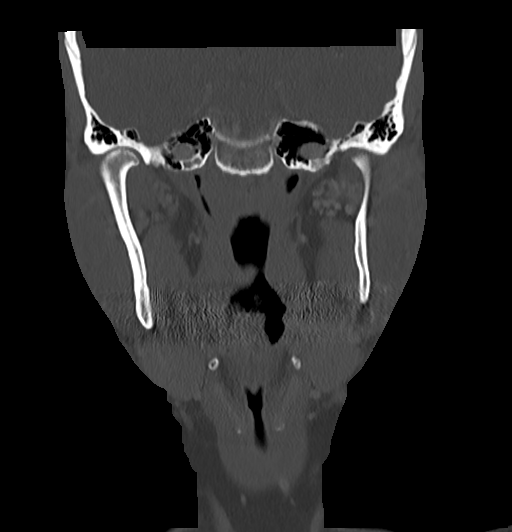

[Series 9: maxillofacial with 2.00 hr60 s3 sag bone · sagittal · 0.31mm/px · 1 of 96 slices shown]
[im 48/96  bone]
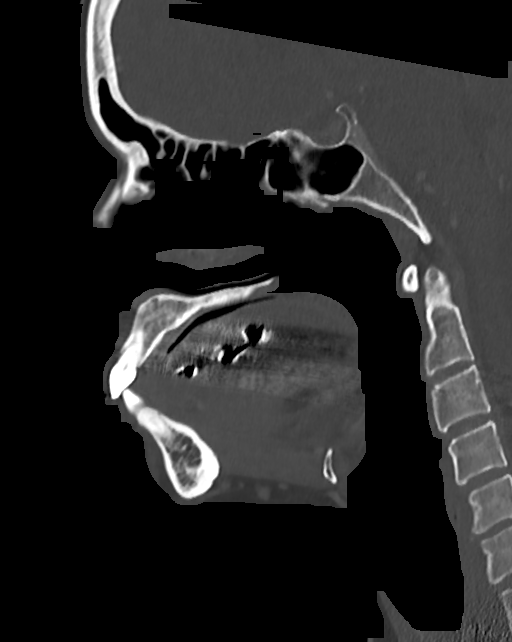

[15 of 47 positions shown; findings below may reference images not displayed]

RADIATION DOSE REDUCTION: This exam was performed according to the
departmental dose-optimization program which includes automated
exposure control, adjustment of the mA and/or kV according to
patient size and/or use of iterative reconstruction technique.

CONTRAST:  75mL B3HQ3C-PJJ IOPAMIDOL (B3HQ3C-PJJ) INJECTION 61%
FINDINGS: Osseous: The cystic area seen on prior MRIs is not clearly
characterized on this study. The contrast resolution MRI is much
greater than that of CT. A low-density intra osseous focus at the
anterior aspect of the mandibular fossa may correspond to a portion
of the cystic lesion (series 3, image 20) and is likely a benign
degenerative geode. There is mild degenerative change of both
temporomandibular joints. No facial fracture or bone lesion.

Orbits: The globes are intact. Normal appearance of the intra- and
extraconal fat. Symmetric extraocular muscles.

Sinuses: No fluid levels or advanced mucosal thickening.

Soft tissues: Normal visualized extracranial soft tissues.

Limited intracranial: Normal.

Other: None.
IMPRESSION: 1. The cystic area seen on prior MRIs is not clearly characterized
on this study. The contrast resolution of MRI is much greater than
that of CT.
2. Low-density intraosseous focus at the anterior aspect of the
mandibular fossa may partially account for the findings on MRI. This
is most consistent with a benign degenerative geode.

## 2023-11-30 DIAGNOSIS — K080 Exfoliation of teeth due to systemic causes: Secondary | ICD-10-CM | POA: Diagnosis not present

## 2023-12-04 DIAGNOSIS — K080 Exfoliation of teeth due to systemic causes: Secondary | ICD-10-CM | POA: Diagnosis not present

## 2023-12-06 DIAGNOSIS — H524 Presbyopia: Secondary | ICD-10-CM | POA: Diagnosis not present

## 2023-12-06 DIAGNOSIS — H2513 Age-related nuclear cataract, bilateral: Secondary | ICD-10-CM | POA: Diagnosis not present

## 2023-12-19 DIAGNOSIS — K080 Exfoliation of teeth due to systemic causes: Secondary | ICD-10-CM | POA: Diagnosis not present

## 2023-12-21 ENCOUNTER — Ambulatory Visit: Payer: Self-pay | Admitting: Orthopedic Surgery

## 2024-02-01 ENCOUNTER — Ambulatory Visit (INDEPENDENT_AMBULATORY_CARE_PROVIDER_SITE_OTHER): Payer: Medicare (Managed Care) | Admitting: Orthopedic Surgery

## 2024-02-01 DIAGNOSIS — M2021 Hallux rigidus, right foot: Secondary | ICD-10-CM | POA: Diagnosis not present

## 2024-02-01 DIAGNOSIS — M722 Plantar fascial fibromatosis: Secondary | ICD-10-CM | POA: Diagnosis not present

## 2024-02-01 DIAGNOSIS — M6701 Short Achilles tendon (acquired), right ankle: Secondary | ICD-10-CM

## 2024-02-05 ENCOUNTER — Encounter: Payer: Self-pay | Admitting: Orthopedic Surgery

## 2024-02-05 NOTE — Progress Notes (Signed)
 "  Office Visit Note   Patient: Katrina Marquez           Date of Birth: Feb 12, 1960           MRN: [id number] Visit Date: 02/01/2024              Requested by: Catalina Bare, MD [id number] [place],  [place] [address] PCP: Catalina Bare, MD  Chief Complaint  Patient presents with   Right Foot - Pain      HPI: Discussed the use of AI scribe software for clinical note transcription with the patient, who gave verbal consent to proceed.  History of Present Illness Katrina Marquez is a 64 year old female with right hallux rigidus, plantar fibromatosis, and Achilles contracture, status post multiple forefoot reconstructions, who presents with persistent right foot numbness and abnormal sensations.  She describes persistent abnormal sensations in the right foot, with the most bothersome area localized to the first web space. She experiences numbness in one toe and burning sensations across the forefoot, particularly worsening at the end of the day. She reports a sensation of stepping on an object and likens it to wearing a flip flop between her toes, even when barefoot. These symptoms have led her to ambulate on the lateral aspect of her foot, resulting in a limp. She denies pain with movement of the great toe but notes increased discomfort beneath the forefoot and has developed calluses in areas of increased pressure.  She has undergone multiple forefoot surgeries, including osteotomies of the second and third metatarsals and bunion and bunionette corrections with screw fixation. She did not experience the current abnormal sensations prior to her initial surgery. After her second surgery, she consistently felt as though something was present between her toes, though this was not documented. She expresses concern that her symptoms were not addressed prior to surgery and questions the absence of preoperative ultrasound. MRI imaging was inconclusive due to hardware artifact, and  she was referred to neurology for blood testing but did not receive results or explanation.  She experiences numbness and tingling across the forefoot, especially at the end of the day. She notes tightness in the Achilles tendon and performs stretching exercises after walking and swimming. She swims laps twice weekly for an hour and walks regularly in the park, followed by stair stretching.  She has small plantar fibromatosis nodules in the plantar fascia, previously treated with corticosteroid injection, which resulted in hypopigmentation extending up her leg. She declines further injections due to this adverse effect. She expresses concern regarding the long-term impact of her foot symptoms and the possibility of further surgical intervention, including potential toe amputation, and is frustrated with prior care and communication regarding her symptoms and treatment.     Assessment & Plan: Visit Diagnoses: No diagnosis found.  Plan: Assessment and Plan Assessment & Plan Hallux rigidus, right foot, status post multiple forefoot reconstructions Chronic hallux rigidus with arthritic changes and joint effusion causing numbness and burning in the first web space. Symptoms likely due to joint effusion and scar tissue from prior surgeries. No Morton's neuroma. Current management focuses on reducing forefoot overload and neuropathic symptoms. - Recommended topical Voltaren gel over the affected area three times daily. - Recommended carbon fiber insole to stiffen footwear and reduce forefoot overload. - Scheduled follow-up in four weeks.  Achilles contracture with forefoot neuropathy, right foot Achilles contracture limits dorsiflexion, causing compensatory forefoot overload and neuropathic symptoms. Neuropathy likely from increased pressure due to altered  gait mechanics. Improving Achilles flexibility should reduce symptoms. - Instructed on Achilles stretching exercises to improve dorsiflexion and  reduce forefoot overload. - Reinforced importance of continued stretching, especially after walking or swimming.  Mild plantar fibromatosis, right foot Small, non-tender plantar fibromatosis nodules present. Prior corticosteroid injection caused hypopigmentation. Nodules asymptomatic; no intervention needed unless painful or enlarged. Surgical excision not indicated due to risk of symptomatic scar tissue. - Advised against further corticosteroid injections due to prior hypopigmentation. - Recommended observation only; no intervention unless nodules become painful or significantly enlarge.      Follow-Up Instructions: No follow-ups on file.   Ortho Exam  Patient is alert, oriented, no adenopathy, well-dressed, normal affect, normal respiratory effort. Physical Exam CARDIOVASCULAR: Strong palpable dorsalis pedis and posterior tibial pulse on the right. EXTREMITIES: Shortened gait with early toe off on the right. Toes are straight. No tenderness to palpation over interdigital web space. Lateral compression of metatarsal heads not tender. MUSCULOSKELETAL: Small plantar fibromatosis nodules <5 mm on plantar aspect of foot. Hallux rigidus, great toe dorsiflexion ~30 degrees. Achilles tightness with dorsiflexion just short of neutral with knee extended. SKIN: Hypopigmentation from injection on plantar aspect of foot.      Imaging: No results found. No images are attached to the encounter.  Labs: Lab Results  Component Value Date   HGBA1C 5.3 07/14/2023   ESRSEDRATE 19 04/08/2019     Lab Results  Component Value Date   ALBUMIN 4.7 07/14/2023   ALBUMIN 4.4 04/08/2019    No results found for: MG No results found for: VD25OH  No results found for: PREALBUMIN    Latest Ref Rng & Units 07/14/2023    9:41 AM 02/22/2023   11:11 AM 04/08/2019    3:49 PM  CBC EXTENDED  WBC 3.4 - 10.8 x10E3/uL 3.2  3.2  4.1   RBC 3.77 - 5.28 x10E6/uL 4.26  4.66  4.32   Hemoglobin 11.1 - 15.9  g/dL 86.5  85.5  86.5   HCT 34.0 - 46.6 % 42.1  42.5  39.8   Platelets 150 - 450 x10E3/uL 158  255  277   NEUT# 1.4 - 7.0 x10E3/uL 1.2  1.3  1.6   Lymph# 0.7 - 3.1 x10E3/uL 1.6  1.6  1.9      There is no height or weight on file to calculate BMI.  Orders:  No orders of the defined types were placed in this encounter.  No orders of the defined types were placed in this encounter.    Procedures: No procedures performed  Clinical Data: No additional findings.  ROS:  All other systems negative, except as noted in the HPI. Review of Systems  Objective: Vital Signs: LMP 10/18/2011   Specialty Comments:  No specialty comments available.  PMFS History: Patient Active Problem List   Diagnosis Date Noted   Numbness 03/23/2023   Herpes simplex 07/14/2022   Eye pressure 07/14/2022   Constipation 07/14/2022   Cyst of jaw 07/14/2022   Colon cancer screening 07/14/2022   Rectal bleeding 07/14/2022   Tinnitus 07/14/2022   Uterine leiomyoma 07/14/2022   Plantar fibromatosis 07/14/2022   History of colonic polyps 07/14/2022   Sternocleidomastoid muscle tenderness 03/08/2022   TMJ (temporomandibular joint syndrome) 03/08/2022   LPRD (laryngopharyngeal reflux disease) 03/08/2022   Weakness of right leg 10/25/2021   Leg muscle spasm 07/31/2021   Numbness of toes 07/30/2021   Loss of energy 07/17/2021   Leg pain, bilateral 04/20/2021   Neck pain 04/20/2021   Pain  in joint of right shoulder 03/11/2020   Weight gain 04/08/2019   Thyroid  nodule 04/08/2019   Appetite increase 04/08/2019   Insomnia 04/08/2019   History of depression 04/08/2019   Other chronic pain 04/08/2019   Nail abnormalities 04/08/2019   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   Brain lesion 08/13/2016   Kidney cysts 08/13/2016   Hepatic cyst 08/13/2016   Dizziness 08/11/2016   Past Medical History:  Diagnosis Date   Allergy    Chronic pain    History of depression     Thyroid  disease     Family History  Problem Relation Age of Onset   CAD Mother    CAD Father    Thyroid  disease Neg Hx     Past Surgical History:  Procedure Laterality Date   BUNIONECTOMY Right    MYOMECTOMY     Social History   Occupational History   Not on file  Tobacco Use   Smoking status: Never    Passive exposure: Never   Smokeless tobacco: Never  Vaping Use   Vaping status: Never Used  Substance and Sexual Activity   Alcohol use: Yes    Comment: Occasionally.   Drug use: Never   Sexual activity: Not on file         "

## 2024-02-13 ENCOUNTER — Telehealth: Payer: Self-pay | Admitting: Podiatry

## 2024-02-13 NOTE — Telephone Encounter (Signed)
 Please enter a referral for pt to be seen by Dr. Lorilee.

## 2024-02-14 ENCOUNTER — Other Ambulatory Visit: Payer: Self-pay | Admitting: Podiatry

## 2024-02-14 DIAGNOSIS — G8929 Other chronic pain: Secondary | ICD-10-CM

## 2024-02-14 NOTE — Progress Notes (Signed)
 Order placed per practice administer

## 2024-03-07 ENCOUNTER — Ambulatory Visit: Payer: Medicare (Managed Care) | Admitting: Orthopedic Surgery
